# Patient Record
Sex: Male | Born: 1970 | Race: Asian | Hispanic: No | Marital: Married | Smoking: Never smoker
Health system: Southern US, Community
[De-identification: ages and names within clinical notes are randomized; demographics above are authoritative.]

## PROBLEM LIST (undated history)

## (undated) ENCOUNTER — Emergency Department (HOSPITAL_COMMUNITY)
Admission: EM | Discharge status: Against Medical Advice | Payer: PRIVATE HEALTH INSURANCE | Attending: Emergency Medicine | Admitting: Emergency Medicine

## (undated) DIAGNOSIS — R519 Headache, unspecified: Secondary | ICD-10-CM

## (undated) DIAGNOSIS — M109 Gout, unspecified: Secondary | ICD-10-CM

## (undated) DIAGNOSIS — I82409 Acute embolism and thrombosis of unspecified deep veins of unspecified lower extremity: Secondary | ICD-10-CM

## (undated) DIAGNOSIS — R51 Headache: Secondary | ICD-10-CM

## (undated) DIAGNOSIS — I10 Essential (primary) hypertension: Secondary | ICD-10-CM

---

## 2001-04-03 ENCOUNTER — Encounter: Payer: Self-pay | Admitting: Emergency Medicine

## 2001-04-03 ENCOUNTER — Inpatient Hospital Stay (HOSPITAL_COMMUNITY): Admission: EM | Admit: 2001-04-03 | Discharge: 2001-04-06 | Payer: Self-pay | Admitting: Emergency Medicine

## 2001-04-04 ENCOUNTER — Encounter: Payer: Self-pay | Admitting: Critical Care Medicine

## 2001-05-01 ENCOUNTER — Emergency Department (HOSPITAL_COMMUNITY): Admission: EM | Admit: 2001-05-01 | Discharge: 2001-05-02 | Payer: Self-pay

## 2001-05-01 ENCOUNTER — Encounter: Payer: Self-pay | Admitting: *Deleted

## 2007-02-28 ENCOUNTER — Emergency Department (HOSPITAL_COMMUNITY): Admission: EM | Admit: 2007-02-28 | Discharge: 2007-02-28 | Payer: Self-pay | Admitting: Emergency Medicine

## 2008-02-15 ENCOUNTER — Encounter: Admission: RE | Admit: 2008-02-15 | Discharge: 2008-02-15 | Payer: Self-pay | Admitting: Pulmonary Disease

## 2008-09-24 ENCOUNTER — Emergency Department (HOSPITAL_COMMUNITY): Admission: EM | Admit: 2008-09-24 | Discharge: 2008-09-24 | Payer: Self-pay | Admitting: Family Medicine

## 2009-01-30 IMAGING — CR DG CHEST 2V
2 series · 2 of 2 positions shown · non-contrast
Comparison: None

CLINICAL DATA: Exposure to tuberculosis

CHEST - 2 VIEW

[view not recorded (1 of 2)]
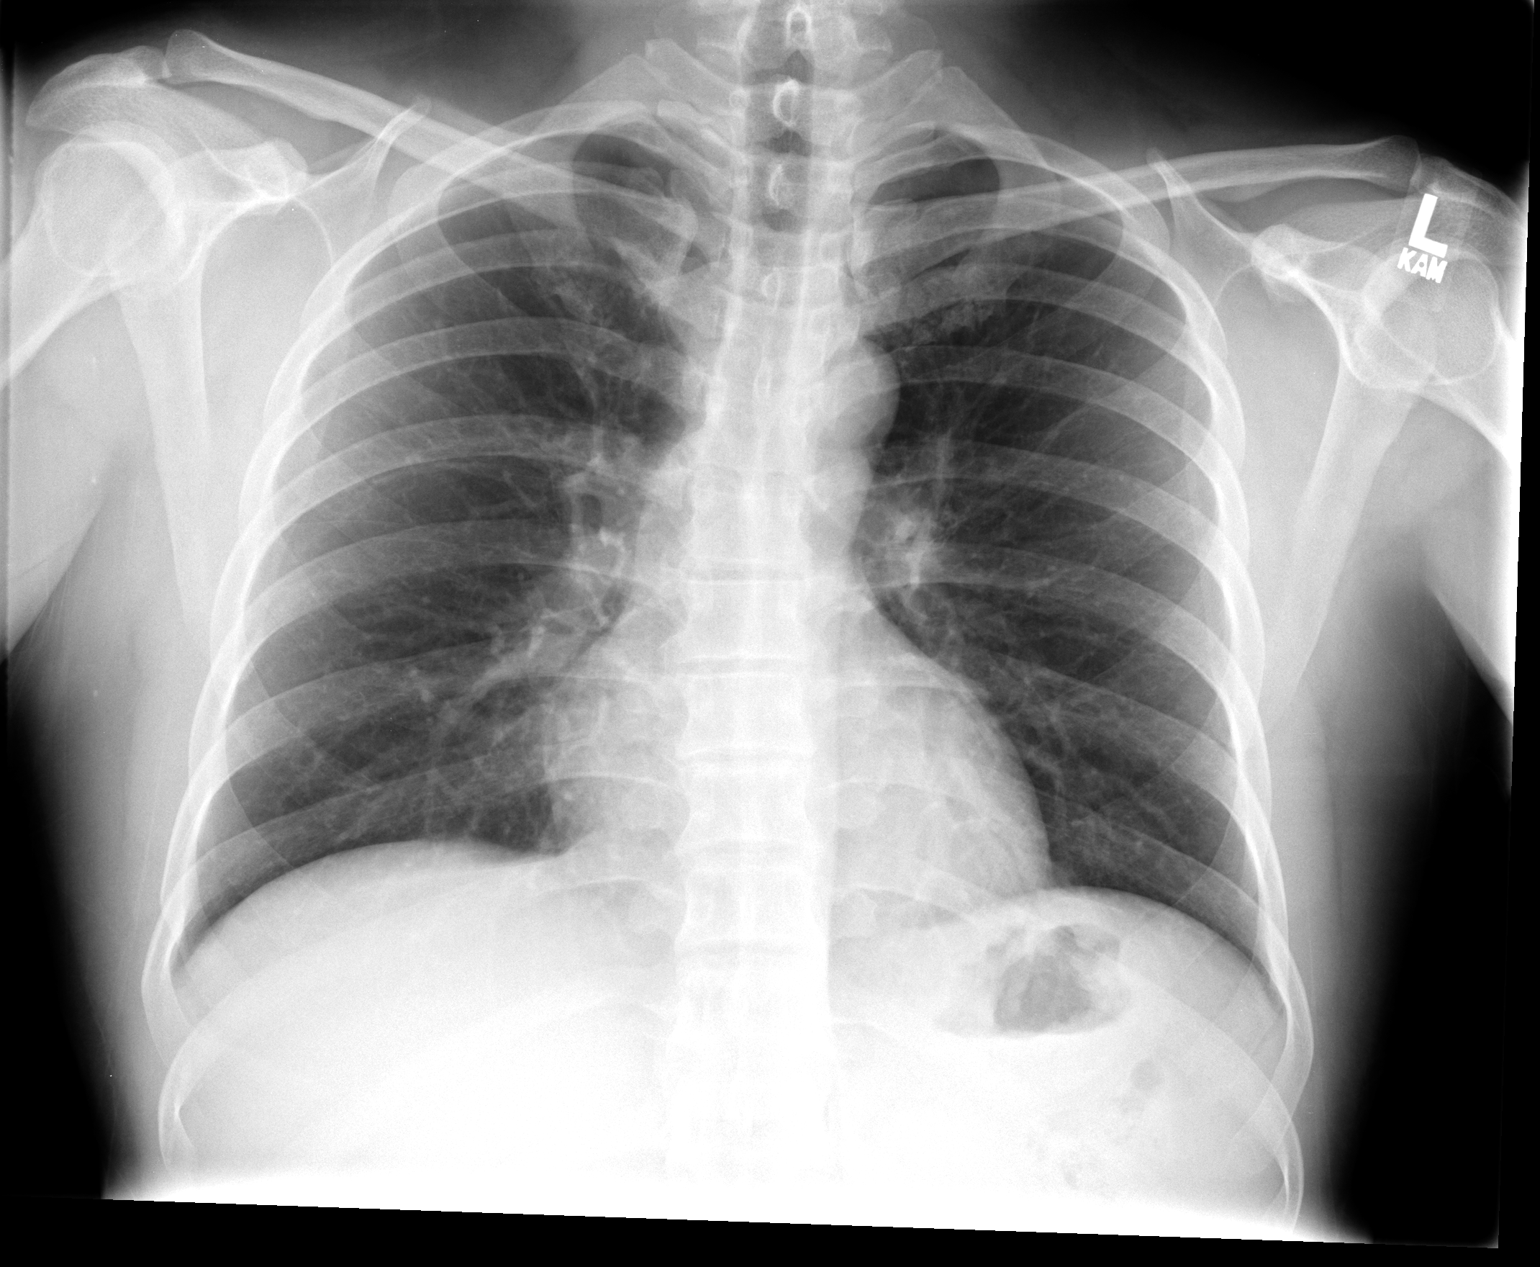

[view not recorded (2 of 2)]
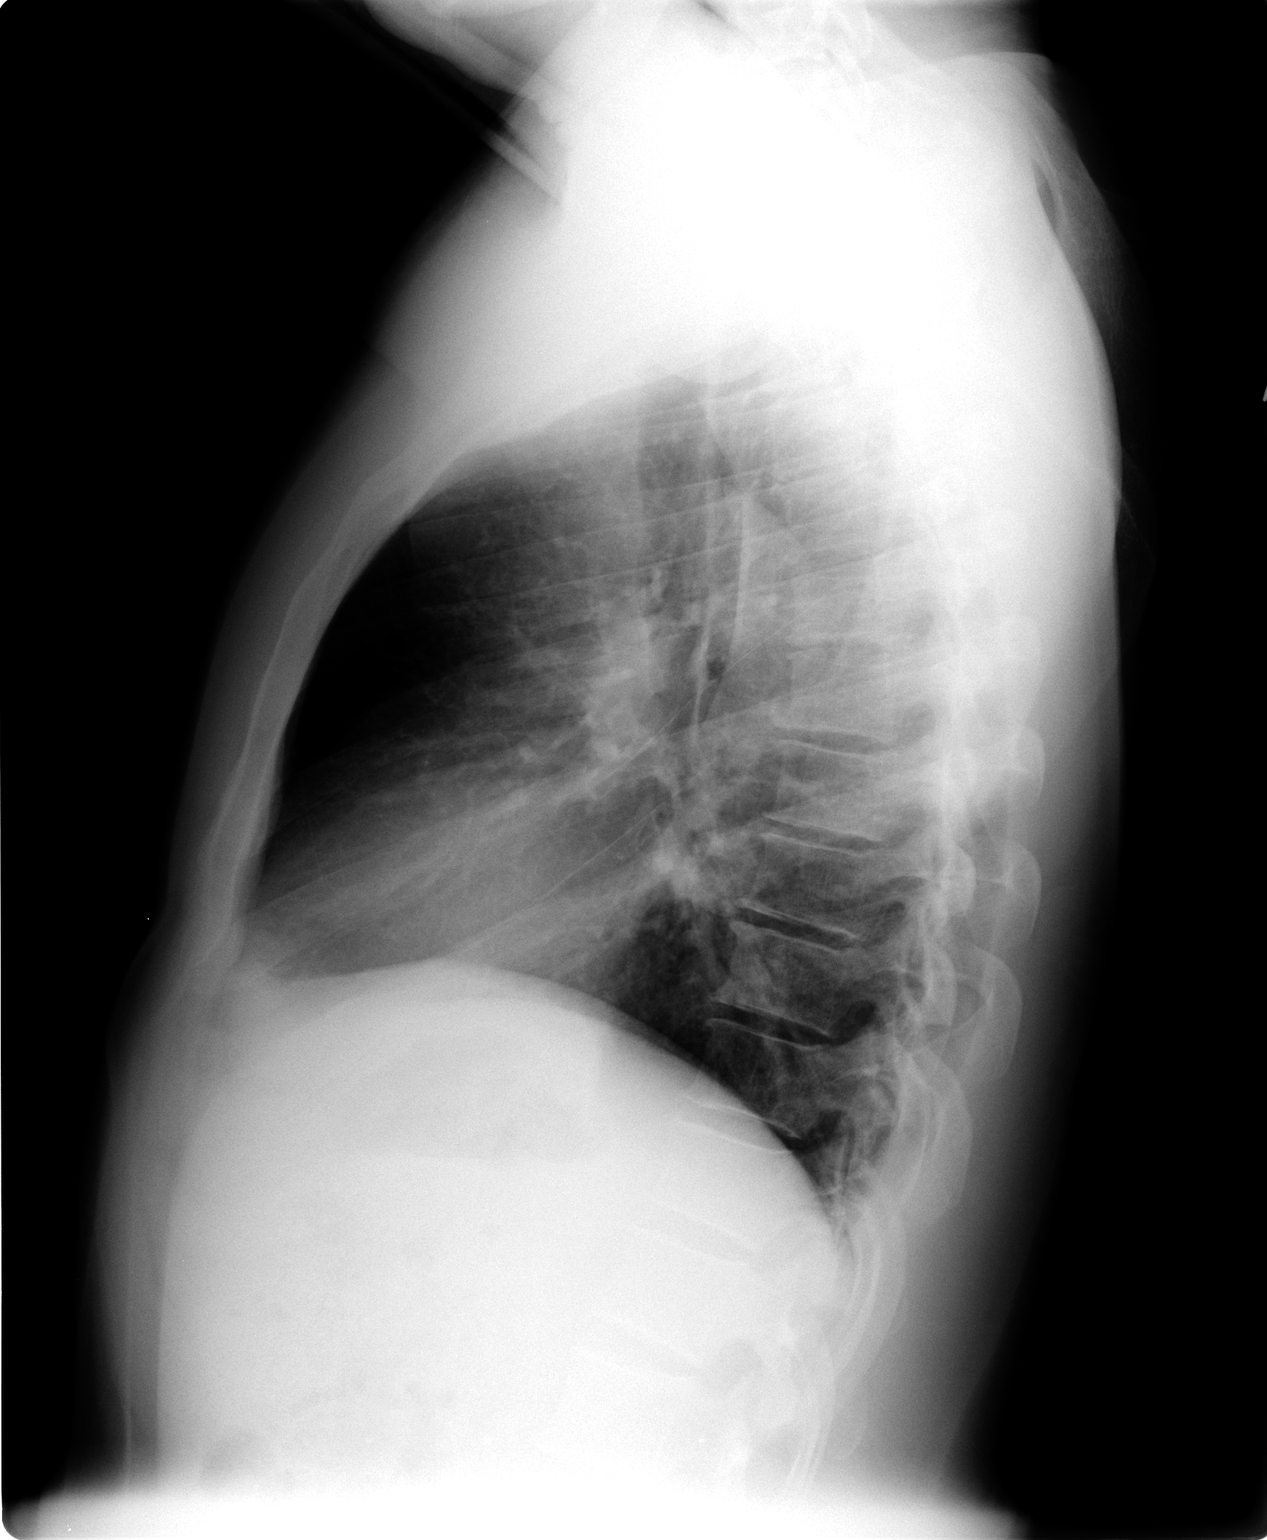

[2 of 2 positions shown; findings below may reference images not displayed]

FINDINGS: No active infiltrate or effusion is seen.  No adenopathy
is noted.  The heart is within normal limits in size.  No bony
abnormality is seen.
IMPRESSION: No active lung disease.

## 2009-08-10 ENCOUNTER — Emergency Department (HOSPITAL_COMMUNITY): Admission: EM | Admit: 2009-08-10 | Discharge: 2009-08-10 | Payer: Self-pay | Admitting: Obstetrics and Gynecology

## 2009-10-28 ENCOUNTER — Emergency Department (HOSPITAL_COMMUNITY): Admission: EM | Admit: 2009-10-28 | Discharge: 2009-10-28 | Payer: Self-pay | Admitting: Emergency Medicine

## 2010-04-08 LAB — BASIC METABOLIC PANEL
BUN: 12 mg/dL (ref 6–23)
CO2: 27 mEq/L (ref 19–32)
Calcium: 9.4 mg/dL (ref 8.4–10.5)
Chloride: 103 mEq/L (ref 96–112)
GFR calc Af Amer: >60 mL/min (ref >60)

## 2010-04-08 LAB — DIFFERENTIAL
Basophils Relative: 0 % (ref 0–1)
Eosinophils Absolute: 0.4 10*3/uL (ref 0.0–0.7)
Eosinophils Relative: 5 % (ref 0–5)
Lymphocytes Relative: 35 % (ref 12–46)
Lymphs Abs: 2.8 10*3/uL (ref 0.7–4.0)
Neutro Abs: 4.3 10*3/uL (ref 1.7–7.7)

## 2010-04-08 LAB — URINALYSIS, ROUTINE W REFLEX MICROSCOPIC
Bilirubin Urine: NEGATIVE
Hgb urine dipstick: NEGATIVE
Nitrite: NEGATIVE
Urobilinogen, UA: 0.2 mg/dL (ref 0.0–1.0)

## 2010-04-08 LAB — CBC
Hemoglobin: 15.5 g/dL (ref 13.0–17.0)
WBC: 8.1 10*3/uL (ref 4.0–10.5)
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
## 2010-07-27 ENCOUNTER — Emergency Department (HOSPITAL_COMMUNITY)
Admission: EM | Admit: 2010-07-27 | Discharge: 2010-07-27 | Disposition: A | Discharge status: Home / Self Care | Payer: PRIVATE HEALTH INSURANCE | Attending: Emergency Medicine | Admitting: Emergency Medicine

## 2010-07-27 DIAGNOSIS — H5710 Ocular pain, unspecified eye: Secondary | ICD-10-CM | POA: Insufficient documentation

## 2010-07-27 DIAGNOSIS — R51 Headache: Secondary | ICD-10-CM | POA: Insufficient documentation

## 2010-07-27 DIAGNOSIS — R112 Nausea with vomiting, unspecified: Secondary | ICD-10-CM | POA: Insufficient documentation

## 2010-07-27 DIAGNOSIS — H53149 Visual discomfort, unspecified: Secondary | ICD-10-CM | POA: Insufficient documentation

## 2010-07-27 DIAGNOSIS — R109 Unspecified abdominal pain: Secondary | ICD-10-CM | POA: Insufficient documentation

## 2010-07-27 LAB — CBC
MCV: 68.1 fL — ABNORMAL LOW (ref 78.0–100.0)
RDW: 13.9 % (ref 11.5–15.5)
WBC: 7.7 10*3/uL (ref 4.0–10.5)

## 2010-07-27 LAB — DIFFERENTIAL
Basophils Absolute: 0.1 10*3/uL (ref 0.0–0.1)
Eosinophils Relative: 5 % (ref 0–5)
Lymphocytes Relative: 37 % (ref 12–46)
Monocytes Absolute: 0.5 10*3/uL (ref 0.1–1.0)
Monocytes Relative: 7 % (ref 3–12)
Neutro Abs: 3.9 10*3/uL (ref 1.7–7.7)
Neutrophils Relative %: 50 % (ref 43–77)

## 2010-07-27 LAB — COMPREHENSIVE METABOLIC PANEL
Albumin: 4.7 g/dL (ref 3.5–5.2)
Alkaline Phosphatase: 113 U/L (ref 39–117)
CO2: 25 mEq/L (ref 19–32)
Calcium: 10.7 mg/dL — ABNORMAL HIGH (ref 8.4–10.5)
Total Bilirubin: 0.7 mg/dL (ref 0.3–1.2)

## 2010-07-27 LAB — POCT I-STAT, CHEM 8
BUN: 12 mg/dL (ref 6–23)
Creatinine, Ser: 1 mg/dL (ref 0.50–1.35)
Glucose, Bld: 98 mg/dL (ref 70–99)
HCT: 55 % — ABNORMAL HIGH (ref 39.0–52.0)
Hemoglobin: 18.7 g/dL — ABNORMAL HIGH (ref 13.0–17.0)
Sodium: 139 mEq/L (ref 135–145)
TCO2: 26 mmol/L (ref 0–100)

## 2010-07-27 LAB — LIPASE, BLOOD: Lipase: 43 U/L (ref 11–59)

## 2010-10-16 LAB — POCT URINALYSIS DIP (DEVICE)
Nitrite: NEGATIVE
Operator id: 235561
Specific Gravity, Urine: 1.01
pH: 7

## 2011-01-18 DIAGNOSIS — R079 Chest pain, unspecified: Secondary | ICD-10-CM | POA: Insufficient documentation
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
## 2011-02-24 ENCOUNTER — Encounter (HOSPITAL_COMMUNITY): Payer: Self-pay | Admitting: *Deleted

## 2011-02-24 ENCOUNTER — Emergency Department (HOSPITAL_COMMUNITY)
Admission: EM | Admit: 2011-02-24 | Discharge: 2011-02-24 | Disposition: A | Discharge status: Home / Self Care | Payer: PRIVATE HEALTH INSURANCE | Source: Home / Self Care | Attending: Family Medicine | Admitting: Family Medicine

## 2011-02-24 DIAGNOSIS — H11001 Unspecified pterygium of right eye: Secondary | ICD-10-CM

## 2011-02-24 DIAGNOSIS — H11009 Unspecified pterygium of unspecified eye: Secondary | ICD-10-CM

## 2011-02-24 HISTORY — DX: Headache: R51

## 2011-02-24 HISTORY — DX: Headache, unspecified: R51.9

## 2011-02-24 MED ORDER — TOBRAMYCIN 0.3 % OP SOLN: 1.0000 [drp] | Freq: Four times a day (QID) | OPHTHALMIC | Status: AC

## 2011-02-24 NOTE — ED Provider Notes (Signed)
History     CSN: [id number]  Arrival date & time 02/24/11  1721   First MD Initiated Contact with Patient 02/24/11 1745      Chief Complaint  Patient presents with  . Eye Problem    (Consider location/radiation/quality/duration/timing/severity/associated sxs/prior treatment) Patient is a 41 y.o. male presenting with eye problem. The history is provided by the patient and the spouse.  Eye Problem  This is a chronic problem. The current episode started more than 1 week ago (since may 2012). The problem has not changed since onset.There is pain in the right eye. The injury mechanism is unknown. The pain is mild. There is no history of trauma to the eye. There is no known exposure to pink eye. Associated symptoms include foreign body sensation and eye redness.    Past Medical History  Diagnosis Date  . Generalized headaches     History reviewed. No pertinent past surgical history.  History reviewed. No pertinent family history.  History  Substance Use Topics  . Smoking status: Not on file  . Smokeless tobacco: Not on file  . Alcohol Use:       Review of Systems  Constitutional: Negative.   HENT: Negative.   Eyes: Positive for redness.    Allergies  Review of patient's allergies indicates no known allergies.  Home Medications   Current Outpatient Rx  Name Route Sig Dispense Refill  . TOBRAMYCIN SULFATE 0.3 % OP SOLN Right Eye Place 1 drop into the right eye every 6 (six) hours. 5 mL 0    BP 138/97  Pulse 95  Temp(Src) 97.5 F (36.4 C) (Oral)  Resp 18  SpO2 99%  Physical Exam  Nursing note and vitals reviewed. Constitutional: He appears well-developed and well-nourished.  HENT:  Head: Normocephalic.  Eyes: EOM and lids are normal. Pupils are equal, round, and reactive to light. No foreign bodies found. Right eye exhibits no discharge. Left eye exhibits no discharge.  Slit lamp exam:      The right eye shows no corneal abrasion, no foreign body, no  hyphema, no hypopyon and no fluorescein uptake.      ED Course  Procedures (including critical care time)  Labs Reviewed - No data to display No results found.   1. Pterygium of right eye       MDM  Slit lamp--pterygium medially., no fb.        Barkley Bruns, MD 02/24/11 [phone]

## 2011-02-24 NOTE — ED Notes (Signed)
Pt  Reports     Irritation  r  Eye      Pt   Reports  He  May  Have  Gotten      Something in his  r  Eye   Almost  9  momths    Ago             Pt   Reports   Blurred  Vision as  Well           Pt  Has      intermittant   rfedness  Of  Eyes  As  Well

## 2011-02-24 NOTE — ED Notes (Signed)
Visual  Acuity  20/40  r   20/25  l

## 2011-11-26 ENCOUNTER — Encounter (HOSPITAL_COMMUNITY): Payer: Self-pay | Admitting: Emergency Medicine

## 2011-11-26 ENCOUNTER — Emergency Department (HOSPITAL_COMMUNITY)
Admission: EM | Admit: 2011-11-26 | Discharge: 2011-11-26 | Disposition: A | Discharge status: Home / Self Care | Payer: PRIVATE HEALTH INSURANCE | Source: Home / Self Care | Attending: Emergency Medicine | Admitting: Emergency Medicine

## 2011-11-26 DIAGNOSIS — G43909 Migraine, unspecified, not intractable, without status migrainosus: Secondary | ICD-10-CM

## 2011-11-26 MED ORDER — DIPHENHYDRAMINE HCL 50 MG/ML IJ SOLN
25.0000 mg | Freq: Once | INTRAMUSCULAR | Status: AC
  Administered 2011-11-26: 25 mg via INTRAMUSCULAR

## 2011-11-26 MED ORDER — DEXAMETHASONE SODIUM PHOSPHATE 10 MG/ML IJ SOLN
INTRAMUSCULAR | Status: AC
  Filled 2011-11-26: qty 1

## 2011-11-26 MED ORDER — SUMATRIPTAN SUCCINATE 50 MG PO TABS: 50.0000 mg | ORAL_TABLET | ORAL | Status: DC | PRN

## 2011-11-26 MED ORDER — PROPRANOLOL HCL ER 60 MG PO CP24: 60.0000 mg | ORAL_CAPSULE | Freq: Every day | ORAL | Status: DC

## 2011-11-26 MED ORDER — DEXAMETHASONE SODIUM PHOSPHATE 10 MG/ML IJ SOLN
10.0000 mg | Freq: Once | INTRAMUSCULAR | Status: AC
  Administered 2011-11-26: 10 mg via INTRAMUSCULAR

## 2011-11-26 MED ORDER — KETOROLAC TROMETHAMINE 60 MG/2ML IM SOLN
INTRAMUSCULAR | Status: AC
  Filled 2011-11-26: qty 2

## 2011-11-26 MED ORDER — KETOROLAC TROMETHAMINE 60 MG/2ML IM SOLN
60.0000 mg | Freq: Once | INTRAMUSCULAR | Status: AC
  Administered 2011-11-26: 60 mg via INTRAMUSCULAR

## 2011-11-26 MED ORDER — DIPHENHYDRAMINE HCL 50 MG/ML IJ SOLN
INTRAMUSCULAR | Status: AC
  Filled 2011-11-26: qty 1

## 2011-11-26 NOTE — ED Provider Notes (Signed)
Chief Complaint  Patient presents with  . Migraine    History of Present Illness:   The patient is a 41 year old male who has had a many year long history of recurring migraine headaches. He has been treated with Imitrex in the past with good results. He has never taken any migraine prevention medication. The current headache began 3 days ago. There was no specific precipitating factor. His right-sided and radiates to his eye and throbbing and severe. It's associated with nausea, vomiting, phonophobia, photophobia, and blurry vision. In addition to this his felt chilled and weak all over. He denies any paresthesias, localized muscle weakness, difficulty with speech, swallowing, or ambulation. No fever, chills, or stiff neck. He denies any back pain.  Review of Systems:  Other than noted above, the patient denies any of the following symptoms: Systemic:  No fever, chills, fatigue, photophobia, stiff neck. Eye:  No redness, eye pain, discharge, blurred vision, or diplopia. ENT:  No nasal congestion, rhinorrhea, sinus pressure or pain, sneezing, earache, or sore throat.  No jaw claudication. Neuro:  No paresthesias, loss of consciousness, seizure activity, muscle weakness, trouble with coordination or gait, trouble speaking or swallowing. Psych:  No depression, anxiety or trouble sleeping.  PMFSH:  Past medical history, family history, social history, meds, and allergies were reviewed.  Physical Exam:   Vital signs:  BP 123/81  Pulse 65  Temp 97.5 F (36.4 C) (Oral)  Resp 18 General:  Alert and oriented.  In no distress. Eye:  Lids and conjunctivas normal.  PERRL,  Full EOMs.  Fundi benign with normal discs and vessels. ENT:  No cranial or facial tenderness to palpation.  TMs and canals clear.  Nasal mucosa was normal and uncongested without any drainage. No intra oral lesions, pharynx clear, mucous membranes moist, dentition normal. Neck:  Supple, full ROM, no tenderness to palpation.  No  adenopathy or mass. Neuro:  Alert and orented times 3.  Speech was clear, fluent, and appropriate.  Cranial nerves intact. No pronator drift, muscle strength normal. Finger to nose normal.  DTRs were 2+ and symmetrical.Station and gait were normal.  Romberg's sign was normal.  Able to perform tandem gait well. Psych:  Normal affect.  Medications given in [facility]:  He was given Decadron 10 mg IM, Toradol 60 mg IM, and Benadryl 25 mg IM and tolerated this well without any immediate side effects.  Assessment:  The encounter diagnosis was Migraine headache.  Plan:   1.  The following meds were prescribed:   New Prescriptions   PROPRANOLOL ER (INDERAL LA) 60 MG 24 HR CAPSULE    Take 1 capsule (60 mg total) by mouth daily.   SUMATRIPTAN (IMITREX) 50 MG TABLET    Take 1 tablet (50 mg total) by mouth every 2 (two) hours as needed for migraine.   2.  The patient was instructed in symptomatic care and handouts were given. 3.  The patient was told to return if becoming worse in any way, if no better in 3 or 4 days, and given some red flag symptoms that would indicate earlier return.    Reuben Likes, MD 11/26/11 2034

## 2011-11-26 NOTE — ED Notes (Signed)
Reports having headache for three days.  Right eye pain with headache.   Tried OTC medication but no relief

## 2013-07-03 ENCOUNTER — Emergency Department (HOSPITAL_COMMUNITY)
Admission: EM | Admit: 2013-07-03 | Discharge: 2013-07-03 | Disposition: A | Discharge status: Home / Self Care | Payer: BC Managed Care – PPO | Source: Home / Self Care | Attending: Family Medicine | Admitting: Family Medicine

## 2013-07-03 ENCOUNTER — Encounter (HOSPITAL_COMMUNITY): Payer: Self-pay | Admitting: Emergency Medicine

## 2013-07-03 ENCOUNTER — Emergency Department (INDEPENDENT_AMBULATORY_CARE_PROVIDER_SITE_OTHER): Payer: BC Managed Care – PPO

## 2013-07-03 DIAGNOSIS — J3489 Other specified disorders of nose and nasal sinuses: Secondary | ICD-10-CM

## 2013-07-03 DIAGNOSIS — R0981 Nasal congestion: Secondary | ICD-10-CM

## 2013-07-03 LAB — POCT I-STAT, CHEM 8
BUN: 19 mg/dL (ref 6–23)
CHLORIDE: 100 meq/L (ref 96–112)
Calcium, Ion: 1.24 mmol/L — ABNORMAL HIGH (ref 1.12–1.23)
Creatinine, Ser: 1 mg/dL (ref 0.50–1.35)
GLUCOSE: 96 mg/dL (ref 70–99)
HEMATOCRIT: 49 % (ref 39.0–52.0)
HEMOGLOBIN: 16.7 g/dL (ref 13.0–17.0)
POTASSIUM: 4 meq/L (ref 3.7–5.3)
Sodium: 141 mEq/L (ref 137–147)
TCO2: 30 mmol/L (ref 0–100)

## 2013-07-03 LAB — CK: CK TOTAL: 236 U/L — AB (ref 7–232)

## 2013-07-03 LAB — CBC
HEMATOCRIT: 42.9 % (ref 39.0–52.0)
Hemoglobin: 13.8 g/dL (ref 13.0–17.0)
MCH: 22.5 pg — AB (ref 26.0–34.0)
MCHC: 32.2 g/dL (ref 30.0–36.0)
MCV: 69.9 fL — AB (ref 78.0–100.0)
PLATELETS: 276 10*3/uL (ref 150–400)
RBC: 6.14 MIL/uL — ABNORMAL HIGH (ref 4.22–5.81)
RDW: 13.9 % (ref 11.5–15.5)
WBC: 7.5 10*3/uL (ref 4.0–10.5)

## 2013-07-03 MED ORDER — PREDNISONE 5 MG PO KIT: PACK | ORAL | Status: DC

## 2013-07-03 MED ORDER — FLUTICASONE PROPIONATE 50 MCG/ACT NA SUSP: 2.0000 | Freq: Every day | NASAL | Status: DC

## 2013-07-03 NOTE — ED Provider Notes (Signed)
Danny Kim is a 43 y.o. male who presents to Urgent Care today for body aches associated with coughing congestion. Symptoms present for 2 weeks. Patient notes occasional blood-tinged sputum. He denies any shortness of breath fevers or chills. Denies any nausea vomiting or diarrhea. He has tried Tylenol which helps some. Denies any headache lightheadedness or dizziness.   Past Medical History  Diagnosis Date  . Generalized headaches    History  Substance Use Topics  . Smoking status: Never Smoker   . Smokeless tobacco: Not on file  . Alcohol Use: Not on file   ROS as above Medications: No current facility-administered medications for this encounter.   Current Outpatient Prescriptions  Medication Sig Dispense Refill  . fluticasone (FLONASE) 50 MCG/ACT nasal spray Place 2 sprays into both nostrils daily.  16 g  2  . PredniSONE 5 MG KIT 12 day dosepack po  1 kit  0  . propranolol ER (INDERAL LA) 60 MG 24 hr capsule Take 1 capsule (60 mg total) by mouth daily.  30 capsule  3  . SUMAtriptan (IMITREX) 50 MG tablet Take 1 tablet (50 mg total) by mouth every 2 (two) hours as needed for migraine.  10 tablet  0    Exam:  BP 121/84  Pulse 71  Temp(Src) 98 F (36.7 C) (Oral)  Resp 18  SpO2 98% Gen: Well NAD HEENT: EOMI,  MMM normal posterior pharynx and tympanic membranes. Lungs: Normal work of breathing. CTABL Heart: RRR no MRG Abd: NABS, Soft. NT, ND Exts: Brisk capillary refill, warm and well perfused. Mildly sore upper and lower extremities to touch. Diffuse. Normal motion throughout.  Results for orders placed during the hospital encounter of 07/03/13 (from the past 24 hour(s))  CK     Status: Abnormal   Collection Time    07/03/13 11:44 AM      Result Value Ref Range   Total CK 236 (*) 7 - 232 U/L  CBC     Status: Abnormal   Collection Time    07/03/13 11:44 AM      Result Value Ref Range   WBC 7.5  4.0 - 10.5 K/uL   RBC 6.14 (*) 4.22 - 5.81 MIL/uL   Hemoglobin 13.8  13.0 -  17.0 g/dL   HCT 42.9  39.0 - 52.0 %   MCV 69.9 (*) 78.0 - 100.0 fL   MCH 22.5 (*) 26.0 - 34.0 pg   MCHC 32.2  30.0 - 36.0 g/dL   RDW 13.9  11.5 - 15.5 %   Platelets 276  150 - 400 K/uL  POCT I-STAT, CHEM 8     Status: Abnormal   Collection Time    07/03/13 11:49 AM      Result Value Ref Range   Sodium 141  137 - 147 mEq/L   Potassium 4.0  3.7 - 5.3 mEq/L   Chloride 100  96 - 112 mEq/L   BUN 19  6 - 23 mg/dL   Creatinine, Ser 1.00  0.50 - 1.35 mg/dL   Glucose, Bld 96  70 - 99 mg/dL   Calcium, Ion 1.24 (*) 1.12 - 1.23 mmol/L   TCO2 30  0 - 100 mmol/L   Hemoglobin 16.7  13.0 - 17.0 g/dL   HCT 49.0  39.0 - 52.0 %   Dg Chest 2 View  07/03/2013   CLINICAL DATA:  Cough.  Hemoptysis.  Chest congestion.  Chest pain.  EXAM: CHEST  2 VIEW  COMPARISON:  02/15/2008  FINDINGS:  Heart size and pulmonary vascularity are normal and the lungs are clear. No effusions. No osseous abnormality.  IMPRESSION: Normal exam, unchanged.   Electronically Signed   By: Rozetta Nunnery M.D.   On: 07/03/2013 11:42    Assessment and Plan: 43 y.o. male with viral uri with possible myositis.  Mildly elevated CK level. Other labs WNL.  Plan for prednisone for cough and congestion.  Follow up with PCP to follow up elevated CK.  Called patient and left a message.   Discussed warning signs or symptoms. Please see discharge instructions. Patient expresses understanding.    Gregor Hams, MD 07/03/13 [phone]

## 2013-07-03 NOTE — ED Notes (Signed)
C/o sick x 1 month w general body aches, dizziness . NAD

## 2013-07-03 NOTE — Discharge Instructions (Signed)
Thank you for coming in today. I will call you with results if abnormal.  Take prednisone.  Use the nasal spray.  Follow up with a primary doctor.  Call or go to the emergency room if you get worse, have trouble breathing, have chest pains, or palpitations.   Sinusitis Sinusitis is redness, soreness, and swelling (inflammation) of the paranasal sinuses. Paranasal sinuses are air pockets within the bones of your face (beneath the eyes, the middle of the forehead, or above the eyes). In healthy paranasal sinuses, mucus is able to drain out, and air is able to circulate through them by way of your nose. However, when your paranasal sinuses are inflamed, mucus and air can become trapped. This can allow bacteria and other germs to grow and cause infection. Sinusitis can develop quickly and last only a short time (acute) or continue over a long period (chronic). Sinusitis that lasts for more than 12 weeks is considered chronic.  CAUSES  Causes of sinusitis include:  Allergies.  Structural abnormalities, such as displacement of the cartilage that separates your nostrils (deviated septum), which can decrease the air flow through your nose and sinuses and affect sinus drainage.  Functional abnormalities, such as when the small hairs (cilia) that line your sinuses and help remove mucus do not work properly or are not present. SYMPTOMS  Symptoms of acute and chronic sinusitis are the same. The primary symptoms are pain and pressure around the affected sinuses. Other symptoms include:  Upper toothache.  Earache.  Headache.  Bad breath.  Decreased sense of smell and taste.  A cough, which worsens when you are lying flat.  Fatigue.  Fever.  Thick drainage from your nose, which often is green and may contain pus (purulent).  Swelling and warmth over the affected sinuses. DIAGNOSIS  Your caregiver will perform a physical exam. During the exam, your caregiver may:  Look in your nose for  signs of abnormal growths in your nostrils (nasal polyps).  Tap over the affected sinus to check for signs of infection.  View the inside of your sinuses (endoscopy) with a special imaging device with a light attached (endoscope), which is inserted into your sinuses. If your caregiver suspects that you have chronic sinusitis, one or more of the following tests may be recommended:  Allergy tests.  Nasal culture A sample of mucus is taken from your nose and sent to a lab and screened for bacteria.  Nasal cytology A sample of mucus is taken from your nose and examined by your caregiver to determine if your sinusitis is related to an allergy. TREATMENT  Most cases of acute sinusitis are related to a viral infection and will resolve on their own within 10 days. Sometimes medicines are prescribed to help relieve symptoms (pain medicine, decongestants, nasal steroid sprays, or saline sprays).  However, for sinusitis related to a bacterial infection, your caregiver will prescribe antibiotic medicines. These are medicines that will help kill the bacteria causing the infection.  Rarely, sinusitis is caused by a fungal infection. In theses cases, your caregiver will prescribe antifungal medicine. For some cases of chronic sinusitis, surgery is needed. Generally, these are cases in which sinusitis recurs more than 3 times per year, despite other treatments. HOME CARE INSTRUCTIONS   Drink plenty of water. Water helps thin the mucus so your sinuses can drain more easily.  Use a humidifier.  Inhale steam 3 to 4 times a day (for example, sit in the bathroom with the shower running).  Apply  a warm, moist washcloth to your face 3 to 4 times a day, or as directed by your caregiver.  Use saline nasal sprays to help moisten and clean your sinuses.  Take over-the-counter or prescription medicines for pain, discomfort, or fever only as directed by your caregiver. SEEK IMMEDIATE MEDICAL CARE IF:  You have  increasing pain or severe headaches.  You have nausea, vomiting, or drowsiness.  You have swelling around your face.  You have vision problems.  You have a stiff neck.  You have difficulty breathing. MAKE SURE YOU:   Understand these instructions.  Will watch your condition.  Will get help right away if you are not doing well or get worse. Document Released: 01/11/2005 Document Revised: 04/05/2011 Document Reviewed: 01/26/2011 [facility] Patient Information 2014 [place], [place].   PRIMARY CARE Merchant navy officerDOCTORS [facility] [address]  [place], [place] [phone]  Fax [phone]  Nature conservation officerLeBauer HealthCare at [facility] [address]  [place], [place] [phone]  Fax [phone]  Nature conservation officerLeBauer HealthCare at [place] / [place] [phone]  [place], [place] Ph [phone]  Fax [phone]  [facility] [address]  [place], [place] [phone]  Fax [phone]  [facility] [address]  [place], [place] [phone]  Fax [phone]  [facility] at [facility] [address] [place]  [place], [place] [phone]  Fax [phone]   [facility] [place] [address] [place] [address] Phone: [phone]   [facility] @ [facility] [address] [place] [address] Phone: [phone]   [facility] @ [place] [address] [place] [address] Phone: [phone]   [facility] @ [place] [address] [place] [address] Phone: [phone]   [facility] @ [place] [address] [place] [address] Phone: [phone] Fax: [phone]   [facility] @ [place] [address] [place] [address] Phone:  [phone]

## 2013-07-04 ENCOUNTER — Telehealth (HOSPITAL_COMMUNITY): Payer: Self-pay | Admitting: Family Medicine

## 2013-07-04 NOTE — ED Notes (Signed)
Patient called back.  Discussed elevated CK.  F/u with PCP  Rodolph Bong, MD 07/04/13 [phone]

## 2013-08-17 ENCOUNTER — Encounter (HOSPITAL_COMMUNITY): Payer: Self-pay | Admitting: Emergency Medicine

## 2013-08-17 ENCOUNTER — Emergency Department (HOSPITAL_COMMUNITY)
Admission: EM | Admit: 2013-08-17 | Discharge: 2013-08-17 | Disposition: A | Discharge status: Home / Self Care | Payer: BC Managed Care – PPO | Source: Home / Self Care

## 2013-08-17 DIAGNOSIS — IMO0001 Reserved for inherently not codable concepts without codable children: Secondary | ICD-10-CM

## 2013-08-17 DIAGNOSIS — M791 Myalgia, unspecified site: Secondary | ICD-10-CM

## 2013-08-17 MED ORDER — DICLOFENAC POTASSIUM 50 MG PO TABS: 50.0000 mg | ORAL_TABLET | Freq: Three times a day (TID) | ORAL | Status: DC

## 2013-08-17 MED ORDER — DICLOFENAC SODIUM 1 % TD GEL: 1.0000 "application " | Freq: Four times a day (QID) | TRANSDERMAL | Status: DC

## 2013-08-17 NOTE — ED Provider Notes (Signed)
CSN: [id number]     Arrival date & time 08/17/13  1413 History   First MD Initiated Contact with Patient 08/17/13 1530     No chief complaint on file.  (Consider location/radiation/quality/duration/timing/severity/associated sxs/prior Treatment) HPI Comments: Muscle pain/soreness in the right axillary musculature for 1 month. 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 Other areas of muscle soreness and achiness in Left arm and low back. No known trauma/injury or single event to produce pain.    Past Medical History  Diagnosis Date  . Generalized headaches    No past surgical history on file. No family history on file. History  Substance Use Topics  . Smoking status: Never Smoker   . Smokeless tobacco: Not on file  . Alcohol Use: Not on file    Review of Systems  Constitutional: Negative.   Respiratory: Negative.   Gastrointestinal: Negative.   Genitourinary: Negative.   Musculoskeletal: Positive for back pain and myalgias.       As per HPI  Skin: Negative.   Neurological: Negative for dizziness, weakness, numbness and headaches.    Allergies  Review of patient's allergies indicates no known allergies.  Home Medications   Prior to Admission medications   Medication Sig Start Date End Date Taking? Authorizing Provider  diclofenac (CATAFLAM) 50 MG tablet Take 1 tablet (50 mg total) by mouth 3 (three) times daily. One tablet TID with food prn pain. 08/17/13   Janne Napoleon, NP  diclofenac sodium (VOLTAREN) 1 % GEL Apply 1 application topically 4 (four) times daily. 08/17/13   Janne Napoleon, NP  fluticasone (FLONASE) 50 MCG/ACT nasal spray Place 2 sprays into both nostrils daily. 07/03/13   Gregor Hams, MD  PredniSONE 5 MG KIT 12 day dosepack po 07/03/13   Gregor Hams, MD  propranolol ER (INDERAL LA) 60 MG 24 hr capsule Take 1 capsule (60 mg total) by mouth daily. 11/26/11   Harden Mo, MD  SUMAtriptan (IMITREX) 50 MG tablet Take 1 tablet (50 mg total) by  mouth every 2 (two) hours as needed for migraine. 11/26/11   Harden Mo, MD   BP 121/79  Pulse 65  Temp(Src) 98.3 F (36.8 C) (Oral)  Resp 18  SpO2 98% Physical Exam  Nursing note and vitals reviewed. Constitutional: He is oriented to person, place, and time. He appears well-developed and well-nourished.  HENT:  Head: Normocephalic and atraumatic.  Eyes: EOM are normal. Left eye exhibits no discharge.  Neck: Normal range of motion. Neck supple.  Cardiovascular: Normal rate.   Pulmonary/Chest: Effort normal. No respiratory distress.  Musculoskeletal: Normal range of motion. He exhibits no edema.  Tenderness to the right pectoral muscle body near insertion point at shoulder. There is tenderness to deeper musculature as well as the inner aspect of the latissimus dorsi as it intersects with the humerus. No deformities, swelling, discoloration, masses or other abnormal findings.  Neurological: He is alert and oriented to person, place, and time. No cranial nerve deficit.  Skin: Skin is warm and dry.  Psychiatric: He has a normal mood and affect.    ED Course  Procedures (including critical care time) Labs Review Labs Reviewed - No data to display  Imaging Review No results found.   MDM   1. Myalgia    Diclofenac gel locally cataflam 50 tid prn Heat locally Limit activity that causes pain.     Janne Napoleon, NP 08/17/13 [phone]

## 2013-08-17 NOTE — Discharge Instructions (Signed)
Muscle Pain Muscle pain (myalgia) may be caused by many things, including:  Overuse or muscle strain, especially if you are not in shape. This is the most common cause of muscle pain.  Injury.  Bruises.  Viruses, such as the flu.  Infectious diseases.  Fibromyalgia, which is a chronic condition that causes muscle tenderness, fatigue, and headache.  Autoimmune diseases, including lupus.  Certain drugs, including ACE inhibitors and statins. Muscle pain may be mild or severe. In most cases, the pain lasts only a short time and goes away without treatment. To diagnose the cause of your muscle pain, your health care provider will take your medical history. This means he or she will ask you when your muscle pain began and what has been happening. If you have not had muscle pain for very long, your health care provider may want to wait before doing much testing. If your muscle pain has lasted a long time, your health care provider may want to run tests right away. If your health care provider thinks your muscle pain may be caused by illness, you may need to have additional tests to rule out certain conditions.  Treatment for muscle pain depends on the cause. Home care is often enough to relieve muscle pain. Your health care provider may also prescribe anti-inflammatory medicine. HOME CARE INSTRUCTIONS Watch your condition for any changes. The following actions may help to lessen any discomfort you are feeling:  Only take over-the-counter or prescription medicines as directed by your health care provider.  Use heat to sore muscles. Apply diclofenac gel as directed  You may alternate applying hot and cold packs to the muscle as directed by your health care provider.  If overuse is causing your muscle pain, slow down your activities until the pain goes away.  Remember that it is normal to feel some muscle pain after starting a workout program. Muscles that have not been used often will be sore  at first.  Do regular, gentle exercises if you are not usually active.  Warm up before exercising to lower your risk of muscle pain.  Do not continue working out if the pain is very bad. Bad pain could mean you have injured a muscle. SEEK MEDICAL CARE IF:  Your muscle pain gets worse, and medicines do not help.  You have muscle pain that lasts longer than 3 days.  You have a rash or fever along with muscle pain.  You have muscle pain after a tick bite.  You have muscle pain while working out, even though you are in good physical condition.  You have redness, soreness, or swelling along with muscle pain.  You have muscle pain after starting a new medicine or changing the dose of a medicine. SEEK IMMEDIATE MEDICAL CARE IF:  You have trouble breathing.  You have trouble swallowing.  You have muscle pain along with a stiff neck, fever, and vomiting.  You have severe muscle weakness or cannot move part of your body. MAKE SURE YOU:   Understand these instructions.  Will watch your condition.  Will get help right away if you are not doing well or get worse. Document Released: 12/03/2005 Document Revised: 01/16/2013 Document Reviewed: 11/07/2012 [facility]Exit[facility] Patient Information 2015 [place]ExitCare, [place]LL[place]. This information is not intended to replace advice given to you by your health care provider. Make sure you discuss any questions you have with your health care provider.

## 2013-08-17 NOTE — ED Notes (Signed)
Provider eval only 

## 2013-08-18 NOTE — ED Provider Notes (Signed)
Medical screening examination/treatment/procedure(s) were performed by resident physician or non-physician practitioner and as supervising physician I was immediately available for consultation/collaboration.   Barkley BrunsKINDL,Kim Oki DOUGLAS MD.   Linna HoffJames D Taetum Flewellen, MD 08/18/13 [phone]

## 2013-10-23 ENCOUNTER — Encounter: Payer: Self-pay | Admitting: *Deleted

## 2014-03-13 ENCOUNTER — Emergency Department (HOSPITAL_COMMUNITY)
Admission: EM | Admit: 2014-03-13 | Discharge: 2014-03-13 | Disposition: A | Discharge status: Home / Self Care | Payer: BLUE CROSS/BLUE SHIELD | Source: Home / Self Care | Attending: Family Medicine | Admitting: Family Medicine

## 2014-03-13 ENCOUNTER — Encounter (HOSPITAL_COMMUNITY): Payer: Self-pay | Admitting: Emergency Medicine

## 2014-03-13 DIAGNOSIS — M542 Cervicalgia: Secondary | ICD-10-CM

## 2014-03-13 DIAGNOSIS — M7552 Bursitis of left shoulder: Secondary | ICD-10-CM

## 2014-03-13 MED ORDER — BUPIVACAINE HCL (PF) 0.5 % IJ SOLN
INTRAMUSCULAR | Status: AC
  Filled 2014-03-13: qty 10

## 2014-03-13 MED ORDER — DICLOFENAC SODIUM 50 MG PO TBEC: 50.0000 mg | DELAYED_RELEASE_TABLET | Freq: Two times a day (BID) | ORAL | Status: DC | PRN

## 2014-03-13 MED ORDER — CYCLOBENZAPRINE HCL 10 MG PO TABS: 10.0000 mg | ORAL_TABLET | Freq: Every evening | ORAL | Status: DC | PRN

## 2014-03-13 MED ORDER — METHYLPREDNISOLONE ACETATE 40 MG/ML IJ SUSP
INTRAMUSCULAR | Status: AC
  Filled 2014-03-13: qty 1

## 2014-03-13 NOTE — Discharge Instructions (Signed)
Thank you for coming in today. Take Flexeril at bedtime as needed for pain Take diclofenac twice daily for pain as needed Follow up with sports medicine for pain as needed. See a regular doctor soon about blood pressure Come back or go to the emergency room if you notice new weakness new numbness problems walking or bowel or bladder problems.  Impingement Syndrome, Rotator Cuff, Bursitis with Rehab Impingement syndrome is a condition that involves inflammation of the tendons of the rotator cuff and the subacromial bursa, that causes pain in the shoulder. The rotator cuff consists of four tendons and muscles that control much of the shoulder and upper arm function. The subacromial bursa is a fluid filled sac that helps reduce friction between the rotator cuff and one of the bones of the shoulder (acromion). Impingement syndrome is usually an overuse injury that causes swelling of the bursa (bursitis), swelling of the tendon (tendonitis), and/or a tear of the tendon (strain). Strains are classified into three categories. Grade 1 strains cause pain, but the tendon is not lengthened. Grade 2 strains include a lengthened ligament, due to the ligament being stretched or partially ruptured. With grade 2 strains there is still function, although the function may be decreased. Grade 3 strains include a complete tear of the tendon or muscle, and function is usually impaired. SYMPTOMS   Pain around the shoulder, often at the outer portion of the upper arm.  Pain that gets worse with shoulder function, especially when reaching overhead or lifting.  Sometimes, aching when not using the arm.  Pain that wakes you up at night.  Sometimes, tenderness, swelling, warmth, or redness over the affected area.  Loss of strength.  Limited motion of the shoulder, especially reaching behind the back (to the back pocket or to unhook bra) or across your body.  Crackling sound (crepitation) when moving the  arm.  Biceps tendon pain and inflammation (in the front of the shoulder). Worse when bending the elbow or lifting. CAUSES  Impingement syndrome is often an overuse injury, in which chronic (repetitive) motions cause the tendons or bursa to become inflamed. A strain occurs when a force is paced on the tendon or muscle that is greater than it can withstand. Common mechanisms of injury include: Stress from sudden increase in duration, frequency, or intensity of training.  Direct hit (trauma) to the shoulder.  Aging, erosion of the tendon with normal use.  Bony bump on shoulder (acromial spur). RISK INCREASES WITH:  Contact sports (football, wrestling, boxing).  Throwing sports (baseball, tennis, volleyball).  Weightlifting and bodybuilding.  Heavy labor.  Previous injury to the rotator cuff, including impingement.  Poor shoulder strength and flexibility.  Failure to warm up properly before activity.  Inadequate protective equipment.  Old age.  Bony bump on shoulder (acromial spur). PREVENTION   Warm up and stretch properly before activity.  Allow for adequate recovery between workouts.  Maintain physical fitness:  Strength, flexibility, and endurance.  Cardiovascular fitness.  Learn and use proper exercise technique. PROGNOSIS  If treated properly, impingement syndrome usually goes away within 6 weeks. Sometimes surgery is required.  RELATED COMPLICATIONS   Longer healing time if not properly treated, or if not given enough time to heal.  Recurring symptoms, that result in a chronic condition.  Shoulder stiffness, frozen shoulder, or loss of motion.  Rotator cuff tendon tear.  Recurring symptoms, especially if activity is resumed too soon, with overuse, with a direct blow, or when using poor technique. TREATMENT  Treatment  first involves the use of ice and medicine, to reduce pain and inflammation. The use of strengthening and stretching exercises may help  reduce pain with activity. These exercises may be performed at home or with a therapist. If non-surgical treatment is unsuccessful after more than 6 months, surgery may be advised. After surgery and rehabilitation, activity is usually possible in 3 months.  MEDICATION  If pain medicine is needed, nonsteroidal anti-inflammatory medicines (aspirin and ibuprofen), or other minor pain relievers (acetaminophen), are often advised.  Do not take pain medicine for 7 days before surgery.  Prescription pain relievers may be given, if your caregiver thinks they are needed. Use only as directed and only as much as you need.  Corticosteroid injections may be given by your caregiver. These injections should be reserved for the most serious cases, because they may only be given a certain number of times. HEAT AND COLD  Cold treatment (icing) should be applied for 10 to 15 minutes every 2 to 3 hours for inflammation and pain, and immediately after activity that aggravates your symptoms. Use ice packs or an ice massage.  Heat treatment may be used before performing stretching and strengthening activities prescribed by your caregiver, physical therapist, or athletic trainer. Use a heat pack or a warm water soak. SEEK MEDICAL CARE IF:   Symptoms get worse or do not improve in 4 to 6 weeks, despite treatment.  New, unexplained symptoms develop. (Drugs used in treatment may produce side effects.) EXERCISES  RANGE OF MOTION (ROM) AND STRETCHING EXERCISES - Impingement Syndrome (Rotator Cuff  Tendinitis, Bursitis) These exercises may help you when beginning to rehabilitate your injury. Your symptoms may go away with or without further involvement from your physician, physical therapist or athletic trainer. While completing these exercises, remember:   Restoring tissue flexibility helps normal motion to return to the joints. This allows healthier, less painful movement and activity.  An effective stretch should  be held for at least 30 seconds.  A stretch should never be painful. You should only feel a gentle lengthening or release in the stretched tissue. STRETCH - Flexion, Standing  Stand with good posture. With an underhand grip on your right / left hand, and an overhand grip on the opposite hand, grasp a broomstick or cane so that your hands are a little more than shoulder width apart.  Keeping your right / left elbow straight and shoulder muscles relaxed, push the stick with your opposite hand, to raise your right / left arm in front of your body and then overhead. Raise your arm until you feel a stretch in your right / left shoulder, but before you have increased shoulder pain.  Try to avoid shrugging your right / left shoulder as your arm rises, by keeping your shoulder blade tucked down and toward your mid-back spine. Hold for __________ seconds.  Slowly return to the starting position. Repeat __________ times. Complete this exercise __________ times per day. STRETCH - Abduction, Supine  Lie on your back. With an underhand grip on your right / left hand and an overhand grip on the opposite hand, grasp a broomstick or cane so that your hands are a little more than shoulder width apart.  Keeping your right / left elbow straight and your shoulder muscles relaxed, push the stick with your opposite hand, to raise your right / left arm out to the side of your body and then overhead. Raise your arm until you feel a stretch in your right / left shoulder,  but before you have increased shoulder pain.  Try to avoid shrugging your right / left shoulder as your arm rises, by keeping your shoulder blade tucked down and toward your mid-back spine. Hold for __________ seconds.  Slowly return to the starting position. Repeat __________ times. Complete this exercise __________ times per day. ROM - Flexion, Active-Assisted  Lie on your back. You may bend your knees for comfort.  Grasp a broomstick or cane so  your hands are about shoulder width apart. Your right / left hand should grip the end of the stick, so that your hand is positioned "thumbs-up," as if you were about to shake hands.  Using your healthy arm to lead, raise your right / left arm overhead, until you feel a gentle stretch in your shoulder. Hold for __________ seconds.  Use the stick to assist in returning your right / left arm to its starting position. Repeat __________ times. Complete this exercise __________ times per day.  ROM - Internal Rotation, Supine   Lie on your back on a firm surface. Place your right / left elbow about 60 degrees away from your side. Elevate your elbow with a folded towel, so that the elbow and shoulder are the same height.  Using a broomstick or cane and your strong arm, pull your right / left hand toward your body until you feel a gentle stretch, but no increase in your shoulder pain. Keep your shoulder and elbow in place throughout the exercise.  Hold for __________ seconds. Slowly return to the starting position. Repeat __________ times. Complete this exercise __________ times per day. STRETCH - Internal Rotation  Place your right / left hand behind your back, palm up.  Throw a towel or belt over your opposite shoulder. Grasp the towel with your right / left hand.  While keeping an upright posture, gently pull up on the towel, until you feel a stretch in the front of your right / left shoulder.  Avoid shrugging your right / left shoulder as your arm rises, by keeping your shoulder blade tucked down and toward your mid-back spine.  Hold for __________ seconds. Release the stretch, by lowering your healthy hand. Repeat __________ times. Complete this exercise __________ times per day. ROM - Internal Rotation   Using an underhand grip, grasp a stick behind your back with both hands.  While standing upright with good posture, slide the stick up your back until you feel a mild stretch in the front  of your shoulder.  Hold for __________ seconds. Slowly return to your starting position. Repeat __________ times. Complete this exercise __________ times per day.  STRETCH - Posterior Shoulder Capsule   Stand or sit with good posture. Grasp your right / left elbow and draw it across your chest, keeping it at the same height as your shoulder.  Pull your elbow, so your upper arm comes in closer to your chest. Pull until you feel a gentle stretch in the back of your shoulder.  Hold for __________ seconds. Repeat __________ times. Complete this exercise __________ times per day. STRENGTHENING EXERCISES - Impingement Syndrome (Rotator Cuff Tendinitis, Bursitis) These exercises may help you when beginning to rehabilitate your injury. They may resolve your symptoms with or without further involvement from your physician, physical therapist or athletic trainer. While completing these exercises, remember:  Muscles can gain both the endurance and the strength needed for everyday activities through controlled exercises.  Complete these exercises as instructed by your physician, physical therapist or athletic trainer.  Increase the resistance and repetitions only as guided.  You may experience muscle soreness or fatigue, but the pain or discomfort you are trying to eliminate should never worsen during these exercises. If this pain does get worse, stop and make sure you are following the directions exactly. If the pain is still present after adjustments, discontinue the exercise until you can discuss the trouble with your clinician.  During your recovery, avoid activity or exercises which involve actions that place your injured hand or elbow above your head or behind your back or head. These positions stress the tissues which you are trying to heal. STRENGTH - Scapular Depression and Adduction   With good posture, sit on a firm chair. Support your arms in front of you, with pillows, arm rests, or on a  table top. Have your elbows in line with the sides of your body.  Gently draw your shoulder blades down and toward your mid-back spine. Gradually increase the tension, without tensing the muscles along the top of your shoulders and the back of your neck.  Hold for __________ seconds. Slowly release the tension and relax your muscles completely before starting the next repetition.  After you have practiced this exercise, remove the arm support and complete the exercise in standing as well as sitting position. Repeat __________ times. Complete this exercise __________ times per day.  STRENGTH - Shoulder Abductors, Isometric  With good posture, stand or sit about 4-6 inches from a wall, with your right / left side facing the wall.  Bend your right / left elbow. Gently press your right / left elbow into the wall. Increase the pressure gradually, until you are pressing as hard as you can, without shrugging your shoulder or increasing any shoulder discomfort.  Hold for __________ seconds.  Release the tension slowly. Relax your shoulder muscles completely before you begin the next repetition. Repeat __________ times. Complete this exercise __________ times per day.  STRENGTH - External Rotators, Isometric  Keep your right / left elbow at your side and bend it 90 degrees.  Step into a door frame so that the outside of your right / left wrist can press against the door frame without your upper arm leaving your side.  Gently press your right / left wrist into the door frame, as if you were trying to swing the back of your hand away from your stomach. Gradually increase the tension, until you are pressing as hard as you can, without shrugging your shoulder or increasing any shoulder discomfort.  Hold for __________ seconds.  Release the tension slowly. Relax your shoulder muscles completely before you begin the next repetition. Repeat __________ times. Complete this exercise __________ times per  day.  STRENGTH - Supraspinatus   Stand or sit with good posture. Grasp a __________ weight, or an exercise band or tubing, so that your hand is "thumbs-up," like you are shaking hands.  Slowly lift your right / left arm in a "V" away from your thigh, diagonally into the space between your side and straight ahead. Lift your hand to shoulder height or as far as you can, without increasing any shoulder pain. At first, many people do not lift their hands above shoulder height.  Avoid shrugging your right / left shoulder as your arm rises, by keeping your shoulder blade tucked down and toward your mid-back spine.  Hold for __________ seconds. Control the descent of your hand, as you slowly return to your starting position. Repeat __________ times. Complete this exercise __________  times per day.  STRENGTH - External Rotators  Secure a rubber exercise band or tubing to a fixed object (table, pole) so that it is at the same height as your right / left elbow when you are standing or sitting on a firm surface.  Stand or sit so that the secured exercise band is at your uninjured side.  Bend your right / left elbow 90 degrees. Place a folded towel or small pillow under your right / left arm, so that your elbow is a few inches away from your side.  Keeping the tension on the exercise band, pull it away from your body, as if pivoting on your elbow. Be sure to keep your body steady, so that the movement is coming only from your rotating shoulder.  Hold for __________ seconds. Release the tension in a controlled manner, as you return to the starting position. Repeat __________ times. Complete this exercise __________ times per day.  STRENGTH - Internal Rotators   Secure a rubber exercise band or tubing to a fixed object (table, pole) so that it is at the same height as your right / left elbow when you are standing or sitting on a firm surface.  Stand or sit so that the secured exercise band is at your  right / left side.  Bend your elbow 90 degrees. Place a folded towel or small pillow under your right / left arm so that your elbow is a few inches away from your side.  Keeping the tension on the exercise band, pull it across your body, toward your stomach. Be sure to keep your body steady, so that the movement is coming only from your rotating shoulder.  Hold for __________ seconds. Release the tension in a controlled manner, as you return to the starting position. Repeat __________ times. Complete this exercise __________ times per day.  STRENGTH - Scapular Protractors, Standing   Stand arms length away from a wall. Place your hands on the wall, keeping your elbows straight.  Begin by dropping your shoulder blades down and toward your mid-back spine.  To strengthen your protractors, keep your shoulder blades down, but slide them forward on your rib cage. It will feel as if you are lifting the back of your rib cage away from the wall. This is a subtle motion and can be challenging to complete. Ask your caregiver for further instruction, if you are not sure you are doing the exercise correctly.  Hold for __________ seconds. Slowly return to the starting position, resting the muscles completely before starting the next repetition. Repeat __________ times. Complete this exercise __________ times per day. STRENGTH - Scapular Protractors, Supine  Lie on your back on a firm surface. Extend your right / left arm straight into the air while holding a __________ weight in your hand.  Keeping your head and back in place, lift your shoulder off the floor.  Hold for __________ seconds. Slowly return to the starting position, and allow your muscles to relax completely before starting the next repetition. Repeat __________ times. Complete this exercise __________ times per day. STRENGTH - Scapular Protractors, Quadruped  Get onto your hands and knees, with your shoulders directly over your hands (or  as close as you can be, comfortably).  Keeping your elbows locked, lift the back of your rib cage up into your shoulder blades, so your mid-back rounds out. Keep your neck muscles relaxed.  Hold this position for __________ seconds. Slowly return to the starting position and allow your  muscles to relax completely before starting the next repetition. Repeat __________ times. Complete this exercise __________ times per day.  STRENGTH - Scapular Retractors  Secure a rubber exercise band or tubing to a fixed object (table, pole), so that it is at the height of your shoulders when you are either standing, or sitting on a firm armless chair.  With a palm down grip, grasp an end of the band in each hand. Straighten your elbows and lift your hands straight in front of you, at shoulder height. Step back, away from the secured end of the band, until it becomes tense.  Squeezing your shoulder blades together, draw your elbows back toward your sides, as you bend them. Keep your upper arms lifted away from your body throughout the exercise.  Hold for __________ seconds. Slowly ease the tension on the band, as you reverse the directions and return to the starting position. Repeat __________ times. Complete this exercise __________ times per day. STRENGTH - Shoulder Extensors   Secure a rubber exercise band or tubing to a fixed object (table, pole) so that it is at the height of your shoulders when you are either standing, or sitting on a firm armless chair.  With a thumbs-up grip, grasp an end of the band in each hand. Straighten your elbows and lift your hands straight in front of you, at shoulder height. Step back, away from the secured end of the band, until it becomes tense.  Squeezing your shoulder blades together, pull your hands down to the sides of your thighs. Do not allow your hands to go behind you.  Hold for __________ seconds. Slowly ease the tension on the band, as you reverse the directions  and return to the starting position. Repeat __________ times. Complete this exercise __________ times per day.  STRENGTH - Scapular Retractors and External Rotators   Secure a rubber exercise band or tubing to a fixed object (table, pole) so that it is at the height as your shoulders, when you are either standing, or sitting on a firm armless chair.  With a palm down grip, grasp an end of the band in each hand. Bend your elbows 90 degrees and lift your elbows to shoulder height, at your sides. Step back, away from the secured end of the band, until it becomes tense.  Squeezing your shoulder blades together, rotate your shoulders so that your upper arms and elbows remain stationary, but your fists travel upward to head height.  Hold for __________ seconds. Slowly ease the tension on the band, as you reverse the directions and return to the starting position. Repeat __________ times. Complete this exercise __________ times per day.  STRENGTH - Scapular Retractors and External Rotators, Rowing   Secure a rubber exercise band or tubing to a fixed object (table, pole) so that it is at the height of your shoulders, when you are either standing, or sitting on a firm armless chair.  With a palm down grip, grasp an end of the band in each hand. Straighten your elbows and lift your hands straight in front of you, at shoulder height. Step back, away from the secured end of the band, until it becomes tense.  Step 1: Squeeze your shoulder blades together. Bending your elbows, draw your hands to your chest, as if you are rowing a boat. At the end of this motion, your hands and elbow should be at shoulder height and your elbows should be out to your sides.  Step 2: Rotate your  shoulders, to raise your hands above your head. Your forearms should be vertical and your upper arms should be horizontal.  Hold for __________ seconds. Slowly ease the tension on the band, as you reverse the directions and return to  the starting position. Repeat __________ times. Complete this exercise __________ times per day.  STRENGTH - Scapular Depressors  Find a sturdy chair without wheels, such as a dining room chair.  Keeping your feet on the floor, and your hands on the chair arms, lift your bottom up from the seat, and lock your elbows.  Keeping your elbows straight, allow gravity to pull your body weight down. Your shoulders will rise toward your ears.  Raise your body against gravity by drawing your shoulder blades down your back, shortening the distance between your shoulders and ears. Although your feet should always maintain contact with the floor, your feet should progressively support less body weight, as you get stronger.  Hold for __________ seconds. In a controlled and slow manner, lower your body weight to begin the next repetition. Repeat __________ times. Complete this exercise __________ times per day.  Document Released: 01/11/2005 Document Revised: 04/05/2011 Document Reviewed: 04/25/2008 [facility] Patient Information 2015 [place], [place]. This information is not intended to replace advice given to you by your health care provider. Make sure you discuss any questions you have with your health care provider.

## 2014-03-13 NOTE — ED Provider Notes (Signed)
Nilsa NuttingKyok Huneycutt is a 44 y.o. male who presents to Urgent Care today for left shoulder pain and right neck pain present for days without injury. 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 He denies any radiating pain weakness or numbness. Left shoulder pain is located in the lateral upper arm and worse with overhand motion. The neck pain is located in the right trapezius area and worse with head motion. No weakness or numbness bowel bladder dysfunction or difficulty walking. Patient has tried ibuprofen which helps.   Past Medical History  Diagnosis Date  . Generalized headaches    History reviewed. No pertinent past surgical history. History  Substance Use Topics  . Smoking status: Never Smoker   . Smokeless tobacco: Not on file  . Alcohol Use: Not on file   ROS as above Medications: No current facility-administered medications for this encounter.   Current Outpatient Prescriptions  Medication Sig Dispense Refill  . cyclobenzaprine (FLEXERIL) 10 MG tablet Take 1 tablet (10 mg total) by mouth at bedtime as needed for muscle spasms. 20 tablet 0  . diclofenac (VOLTAREN) 50 MG EC tablet Take 1 tablet (50 mg total) by mouth 2 (two) times daily as needed. 30 tablet 0  . fluticasone (FLONASE) 50 MCG/ACT nasal spray Place 2 sprays into both nostrils daily. 16 g 2  . propranolol ER (INDERAL LA) 60 MG 24 hr capsule Take 1 capsule (60 mg total) by mouth daily. 30 capsule 3  . SUMAtriptan (IMITREX) 50 MG tablet Take 1 tablet (50 mg total) by mouth every 2 (two) hours as needed for migraine. 10 tablet 0   No Known Allergies   Exam:  BP 150/89 mmHg  Pulse 76  Temp(Src) 97.8 F (36.6 C) (Oral)  Resp 16  SpO2 97% Gen: Well NAD HEENT: EOMI,  MMM Lungs: Normal work of breathing. CTABL Heart: RRR no MRG Abd: NABS, Soft. Nondistended, Nontender Exts: Brisk capillary refill, warm and well perfused.  Neck: Nontender to spinal midline. Normal neck range of motion. Tender palpation right trapezius  negative Spurling's test. Upper extremity reflexes and strength are intact throughout. Left shoulder normal-appearing not significantly tender. Range of motion is normal with the exception of abduction limited to about 120 due to pain with actively and passively. Positive impingement testing. Strength is intact bilateral to all range of motion exam but painful with abduction. Negative Yergason's and speeds test. Right shoulder normal-appearing nontender normal motion normal strength negative impingement. Strength is intact bilateral. Sensation and pulses are equal and normal bilateral wrists.Subacromial Injection:  Left shoulder subacromial injection Consent obtained and time out performed.  Area cleaned with alcohol.  40Mg  of depomedrol and 3ml of 0.5% marcaine was injected into the subacromial bursa without complication or bleeding. Patient tolerated the procedure well.     No results found for this or any previous visit (from the past 24 hour(s)). No results found.  Assessment and Plan: 44 y.o. male with left shoulder impingement or subacromial bursitis treated with corticosteroid injection and NSAIDs. Home exercise program follow up with sports medicine. Additionally patient has right trapezius strain. Treat with Flexeril and NSAIDs follow up with sports medicine. Workup provided.  Discussed warning signs or symptoms. Please see discharge instructions. Patient expresses understanding.     Rodolph BongEvan S Jamaia Brum, MD 03/13/14 [phone]

## 2014-03-13 NOTE — ED Notes (Signed)
C/o right and left shoulder pain onset 4 days Right side radiates to neck Denies inj/trauma Works as a Freight forwarderroofer lifting heavy objects Alert, no signs of acute distress.

## 2014-08-23 ENCOUNTER — Emergency Department (HOSPITAL_COMMUNITY)
Admission: EM | Admit: 2014-08-23 | Discharge: 2014-08-23 | Disposition: A | Discharge status: Home / Self Care | Payer: BLUE CROSS/BLUE SHIELD | Attending: Emergency Medicine | Admitting: Emergency Medicine

## 2014-08-23 ENCOUNTER — Encounter (HOSPITAL_COMMUNITY): Payer: Self-pay | Admitting: Emergency Medicine

## 2014-08-23 ENCOUNTER — Emergency Department (HOSPITAL_COMMUNITY): Payer: BLUE CROSS/BLUE SHIELD

## 2014-08-23 DIAGNOSIS — R059 Cough, unspecified: Secondary | ICD-10-CM

## 2014-08-23 DIAGNOSIS — R509 Fever, unspecified: Secondary | ICD-10-CM | POA: Insufficient documentation

## 2014-08-23 DIAGNOSIS — R6883 Chills (without fever): Secondary | ICD-10-CM | POA: Diagnosis present

## 2014-08-23 DIAGNOSIS — R05 Cough: Secondary | ICD-10-CM

## 2014-08-23 LAB — COMPREHENSIVE METABOLIC PANEL
ALBUMIN: 3.9 g/dL (ref 3.5–5.0)
ALK PHOS: 99 U/L (ref 38–126)
ALT: 30 U/L (ref 17–63)
AST: 43 U/L — ABNORMAL HIGH (ref 15–41)
Anion gap: 9 (ref 5–15)
BILIRUBIN TOTAL: 0.9 mg/dL (ref 0.3–1.2)
BUN: 14 mg/dL (ref 6–20)
CALCIUM: 8.8 mg/dL — AB (ref 8.9–10.3)
CHLORIDE: 100 mmol/L — AB (ref 101–111)
CO2: 23 mmol/L (ref 22–32)
Creatinine, Ser: 1.01 mg/dL (ref 0.61–1.24)
GFR calc non Af Amer: >60 mL/min (ref >60)
Glucose, Bld: 76 mg/dL (ref 65–99)
Potassium: 4.2 mmol/L (ref 3.5–5.1)
SODIUM: 132 mmol/L — AB (ref 135–145)
Total Protein: 7.6 g/dL (ref 6.5–8.1)

## 2014-08-23 LAB — URINALYSIS, ROUTINE W REFLEX MICROSCOPIC
Bilirubin Urine: NEGATIVE
GLUCOSE, UA: NEGATIVE mg/dL
Hgb urine dipstick: NEGATIVE
Ketones, ur: NEGATIVE mg/dL
LEUKOCYTES UA: NEGATIVE
Nitrite: NEGATIVE
PROTEIN: NEGATIVE mg/dL
Specific Gravity, Urine: 1.009 (ref 1.005–1.030)
UROBILINOGEN UA: 0.2 mg/dL (ref 0.0–1.0)
pH: 7 (ref 5.0–8.0)

## 2014-08-23 LAB — CBC WITH DIFFERENTIAL/PLATELET
Basophils Absolute: 0 10*3/uL (ref 0.0–0.1)
Basophils Relative: 0 % (ref 0–1)
EOS PCT: 1 % (ref 0–5)
Eosinophils Absolute: 0.1 10*3/uL (ref 0.0–0.7)
HCT: 38.7 % — ABNORMAL LOW (ref 39.0–52.0)
HEMOGLOBIN: 12.5 g/dL — AB (ref 13.0–17.0)
LYMPHS PCT: 13 % (ref 12–46)
Lymphs Abs: 1.4 10*3/uL (ref 0.7–4.0)
MCH: 21.8 pg — ABNORMAL LOW (ref 26.0–34.0)
MCHC: 32.3 g/dL (ref 30.0–36.0)
MCV: 67.5 fL — AB (ref 78.0–100.0)
Monocytes Absolute: 1.4 10*3/uL — ABNORMAL HIGH (ref 0.1–1.0)
Monocytes Relative: 13 % — ABNORMAL HIGH (ref 3–12)
NEUTROS ABS: 7.5 10*3/uL (ref 1.7–7.7)
Neutrophils Relative %: 73 % (ref 43–77)
PLATELETS: 277 10*3/uL (ref 150–400)
RBC: 5.73 MIL/uL (ref 4.22–5.81)
RDW: 14.7 % (ref 11.5–15.5)
WBC: 10.4 10*3/uL (ref 4.0–10.5)

## 2014-08-23 LAB — I-STAT CG4 LACTIC ACID, ED: Lactic Acid, Venous: 0.74 mmol/L (ref 0.5–2.0)

## 2014-08-23 MED ORDER — SODIUM CHLORIDE 0.9 % IV BOLUS (SEPSIS)
1000.0000 mL | INTRAVENOUS | Status: AC
  Administered 2014-08-23 (×2): 1000 mL via INTRAVENOUS

## 2014-08-23 MED ORDER — ACETAMINOPHEN 325 MG PO TABS
325.0000 mg | ORAL_TABLET | Freq: Once | ORAL | Status: AC
  Administered 2014-08-23: 325 mg via ORAL
  Filled 2014-08-23: qty 1

## 2014-08-23 MED ORDER — ACETAMINOPHEN 325 MG PO TABS
650.0000 mg | ORAL_TABLET | Freq: Once | ORAL | Status: AC | PRN
  Administered 2014-08-23: 650 mg via ORAL
  Filled 2014-08-23: qty 2

## 2014-08-23 MED ORDER — SODIUM CHLORIDE 0.9 % IV BOLUS (SEPSIS)
500.0000 mL | INTRAVENOUS | Status: AC
  Administered 2014-08-23: 500 mL via INTRAVENOUS

## 2014-08-23 NOTE — ED Provider Notes (Signed)
CSN: [id number]     Arrival date & time 08/23/14  4098 History   First MD Initiated Contact with Patient 08/23/14 [phone]     Chief Complaint  Patient presents with  . Chills    The patient said he woke up with chills.  The patient's wife said this is the third night that he has woken up with "chills".      (Consider location/radiation/quality/duration/timing/severity/associated sxs/prior Treatment) HPI Patient is a 44 year old male with no significant past medical history presents the ER complaining of chills. Patient states her the past 3 nights he has been experiencing chills. Patient reports worsening of them are at the night last night, and came to the emergency room this morning. Patient denies any associated headaches, sore throat, nausea, vomiting, chest pain, shortness of breath, cough, abdominal pain, diarrhea, dysuria. Patient states his wife had similar symptoms last week.  Past Medical History  Diagnosis Date  . Generalized headaches    History reviewed. No pertinent past surgical history. History reviewed. No pertinent family history. History  Substance Use Topics  . Smoking status: Never Smoker   . Smokeless tobacco: Never Used  . Alcohol Use: No    Review of Systems  Constitutional: Positive for fever.  HENT: Negative for trouble swallowing.   Eyes: Negative for visual disturbance.  Respiratory: Negative for shortness of breath.   Cardiovascular: Negative for chest pain.  Gastrointestinal: Negative for nausea, vomiting and abdominal pain.  Genitourinary: Negative for dysuria.  Musculoskeletal: Negative for neck pain.  Skin: Negative for rash.  Neurological: Negative for dizziness, weakness and numbness.  Psychiatric/Behavioral: Negative.       Allergies  Review of patient's allergies indicates no known allergies.  Home Medications   Prior to Admission medications   Medication Sig Start Date End Date Taking? Authorizing Provider  cyclobenzaprine  (FLEXERIL) 10 MG tablet Take 1 tablet (10 mg total) by mouth at bedtime as needed for muscle spasms. Patient not taking: Reported on 08/23/2014 03/13/14   Rodolph Bong, MD  diclofenac (VOLTAREN) 50 MG EC tablet Take 1 tablet (50 mg total) by mouth 2 (two) times daily as needed. Patient not taking: Reported on 08/23/2014 03/13/14   Rodolph Bong, MD  fluticasone [facility]) 50 MCG/ACT nasal spray Place 2 sprays into both nostrils daily. Patient not taking: Reported on 08/23/2014 07/03/13   Rodolph Bong, MD  propranolol ER (INDERAL LA) 60 MG 24 hr capsule Take 1 capsule (60 mg total) by mouth daily. Patient not taking: Reported on 08/23/2014 11/26/11   Reuben Likes, MD  SUMAtriptan (IMITREX) 50 MG tablet Take 1 tablet (50 mg total) by mouth every 2 (two) hours as needed for migraine. Patient not taking: Reported on 08/23/2014 11/26/11   Reuben Likes, MD   BP 115/71 mmHg  Pulse 68  Temp(Src) 98 F (36.7 C) (Oral)  Resp 18  Ht 5\' 2"  (1.575 m)  Wt 160 lb (72.576 kg)  BMI 29.26 kg/m2  SpO2 98% Physical Exam  Constitutional: He is oriented to person, place, and time. He appears well-developed and well-nourished. No distress.  HENT:  Head: Normocephalic and atraumatic.  Mouth/Throat: Uvula is midline and oropharynx is clear and moist. No trismus in the jaw. No uvula swelling. No oropharyngeal exudate, posterior oropharyngeal edema, posterior oropharyngeal erythema or tonsillar abscesses.  Eyes: Right eye exhibits no discharge. Left eye exhibits no discharge. No scleral icterus.  Neck: Normal range of motion and full passive range of motion without pain. Neck supple. No  spinous process tenderness and no muscular tenderness present. No rigidity. No edema, no erythema and normal range of motion present. No Brudzinski's sign and no Kernig's sign noted.  Cardiovascular: Normal rate, regular rhythm and normal heart sounds.   No murmur heard. Pulmonary/Chest: Effort normal and breath sounds normal. No  respiratory distress.  Abdominal: Soft. There is no tenderness.  Genitourinary: Rectum normal and prostate normal. Rectal exam shows no external hemorrhoid, no internal hemorrhoid, no fissure, no mass, no tenderness and anal tone normal. Prostate is not enlarged and not tender.  Rectal tone normal. Prostate nonenlarged, nontender. Small amount of brown colored stool noted in rectal vault. Chaperone present during entire GU exam.  Musculoskeletal: Normal range of motion. He exhibits no edema or tenderness.  Neurological: He is alert and oriented to person, place, and time. He has normal strength. No cranial nerve deficit or sensory deficit. Coordination normal. GCS eye subscore is 4. GCS verbal subscore is 5. GCS motor subscore is 6.  Patient fully alert, answering questions appropriately in full, clear sentences. Cranial nerves II through XII grossly intact. Motor strength 5 out of 5 in all major muscle groups of upper and lower extremities. Distal sensation intact.   Skin: Skin is warm and dry. No rash noted. He is not diaphoretic.  Psychiatric: He has a normal mood and affect.  Nursing note and vitals reviewed.   ED Course  Procedures (including critical care time) Labs Review Labs Reviewed  COMPREHENSIVE METABOLIC PANEL - Abnormal; Notable for the following:    Sodium 132 (*)    Chloride 100 (*)    Calcium 8.8 (*)    AST 43 (*)    All other components within normal limits  CBC WITH DIFFERENTIAL/PLATELET - Abnormal; Notable for the following:    Hemoglobin 12.5 (*)    HCT 38.7 (*)    MCV 67.5 (*)    MCH 21.8 (*)    Monocytes Relative 13 (*)    Monocytes Absolute 1.4 (*)    All other components within normal limits  CULTURE, BLOOD (ROUTINE X 2)  CULTURE, BLOOD (ROUTINE X 2)  URINE CULTURE  URINALYSIS, ROUTINE W REFLEX MICROSCOPIC (NOT AT [facility])  I-STAT CG4 LACTIC ACID, ED    Imaging Review Dg Chest 2 View  08/23/2014   CLINICAL DATA:  Cough for 3 days.  EXAM: CHEST  2 VIEW   COMPARISON:  Chest radiograph July 03, 2013  FINDINGS: Cardiomediastinal silhouette is unremarkable. The lungs are clear without pleural effusions or focal consolidations. Trachea projects midline and there is no pneumothorax. Soft tissue planes and included osseous structures are non-suspicious. Mild degenerative change of the thoracic spine.  IMPRESSION: No acute cardiopulmonary process.   Electronically Signed   By: Awilda Metro M.D.   On: 08/23/2014 05:59     EKG Interpretation None      MDM   Final diagnoses:  Fever and chills    Patient here with fever of unknown origin. Patient's exam overall is benign. Patient's only complaints are chills over the past several days. Lab work and imaging are unremarkable for acute pathology, there is no obvious source of patient's fever. Rectal exam is benign, no concern for prostatitis. Urinalysis unremarkable for UTI. No concern for pneumonia on chest x-ray. No leukocytosis or anemia. Abdominal exam is benign. Lactic acid 0.74. Fever was treated here, decreased to 99. Patient rehydrated with IV fluids. Patient tolerating by mouth well. No concern for sepsis or SIRS. No meningeal signs or concern for meningitis.  No concern for strep pharyngitis based on history or exam. Patient afebrile, hemodynamically stable and in no acute distress. Patient stable for discharge. Strongly encouraged patient to follow up with a PCP, gave resource guide to help find one. Return precautions discussed, patient verbalizes understanding and agreement of this plan.  BP 115/71 mmHg  Pulse 68  Temp(Src) 98 F (36.7 C) (Oral)  Resp 18  Ht 5\' 2"  (1.575 m)  Wt 160 lb (72.576 kg)  BMI 29.26 kg/m2  SpO2 98%  Signed,  Ladona Mow, PA-C 10:13 AM  Patient discussed with Dr. Nelva Nay, MD    Ladona Mow, PA-C 08/23/14 1005  Ladona Mow, PA-C 08/23/14 1013  Nelva Nay, MD 08/24/14 [phone]

## 2014-08-23 NOTE — Discharge Instructions (Signed)
Follow-up with a primary care doctor. Refer to resource guide below. You may alternate ibuprofen and Tylenol every 3-4 hours for fever and chills. Return to the ER if any severe pain, difficulty breathing, difficulty urinating, dizziness, weakness. Drink plenty of fluids including water and Gatorade, eat small, bland meals.  Fever, Adult A fever is a higher than normal body temperature. In an adult, an oral temperature around 98.6 F (37 C) is considered normal. A temperature of 100.4 F (38 C) or higher is generally considered a fever. Mild or moderate fevers generally have no long-term effects and often do not require treatment. Extreme fever (greater than or equal to 106 F or 41.1 C) can cause seizures. The sweating that may occur with repeated or prolonged fever may cause dehydration. Elderly people can develop confusion during a fever. A measured temperature can vary with:  Age.  Time of day.  Method of measurement (mouth, underarm, rectal, or ear). The fever is confirmed by taking a temperature with a thermometer. Temperatures can be taken different ways. Some methods are accurate and some are not.  An oral temperature is used most commonly. Electronic thermometers are fast and accurate.  An ear temperature will only be accurate if the thermometer is positioned as recommended by the manufacturer.  A rectal temperature is accurate and done for those adults who have a condition where an oral temperature cannot be taken.  An underarm (axillary) temperature is not accurate and not recommended. Fever is a symptom, not a disease.  CAUSES   Infections commonly cause fever.  Some noninfectious causes for fever include:  Some arthritis conditions.  Some thyroid or adrenal gland conditions.  Some immune system conditions.  Some types of cancer.  A medicine reaction.  High doses of certain street drugs such as methamphetamine.  Dehydration.  Exposure to high outside or room  temperatures.  Occasionally, the source of a fever cannot be determined. This is sometimes called a "fever of unknown origin" (FUO).  Some situations may lead to a temporary rise in body temperature that may go away on its own. Examples are:  Childbirth.  Surgery.  Intense exercise. HOME CARE INSTRUCTIONS   Take appropriate medicines for fever. Follow dosing instructions carefully. If you use acetaminophen to reduce the fever, be careful to avoid taking other medicines that also contain acetaminophen. Do not take aspirin for a fever if you are younger than age 11. There is an association with Reye's syndrome. Reye's syndrome is a rare but potentially deadly disease.  If an infection is present and antibiotics have been prescribed, take them as directed. Finish them even if you start to feel better.  Rest as needed.  Maintain an adequate fluid intake. To prevent dehydration during an illness with prolonged or recurrent fever, you may need to drink extra fluid.Drink enough fluids to keep your urine clear or pale yellow.  Sponging or bathing with room temperature water may help reduce body temperature. Do not use ice water or alcohol sponge baths.  Dress comfortably, but do not over-bundle. SEEK MEDICAL CARE IF:   You are unable to keep fluids down.  You develop vomiting or diarrhea.  You are not feeling at least partly better after 3 days.  You develop new symptoms or problems. SEEK IMMEDIATE MEDICAL CARE IF:   You have shortness of breath or trouble breathing.  You develop excessive weakness.  You are dizzy or you faint.  You are extremely thirsty or you are making little or no urine.  You develop new pain that was not there before (such as in the head, neck, chest, back, or abdomen).  You have persistent vomiting and diarrhea for more than 1 to 2 days.  You develop a stiff neck or your eyes become sensitive to light.  You develop a skin rash.  You have a fever or  persistent symptoms for more than 2 to 3 days.  You have a fever and your symptoms suddenly get worse. MAKE SURE YOU:   Understand these instructions.  Will watch your condition.  Will get help right away if you are not doing well or get worse. Document Released: 07/07/2000 Document Revised: 05/28/2013 Document Reviewed: 11/12/2010 [facility] Patient Information 2015 [place], [place]. This information is not intended to replace advice given to you by your health care provider. Make sure you discuss any questions you have with your health care provider.  Although you have not been having diarrhea, these food choices may help alleviate the symptoms you have been experiencing.  Food Choices to Help Relieve Diarrhea When you have diarrhea, the foods you eat and your eating habits are very important. Choosing the right foods and drinks can help relieve diarrhea. Also, because diarrhea can last up to 7 days, you need to replace lost fluids and electrolytes (such as sodium, potassium, and chloride) in order to help prevent dehydration.  WHAT GENERAL GUIDELINES DO I NEED TO FOLLOW?  Slowly drink 1 cup (8 oz) of fluid for each episode of diarrhea. If you are getting enough fluid, your urine will be clear or pale yellow.  Eat starchy foods. Some good choices include white rice, white toast, pasta, low-fiber cereal, baked potatoes (without the skin), saltine crackers, and bagels.  Avoid large servings of any cooked vegetables.  Limit fruit to two servings per day. A serving is  cup or 1 small piece.  Choose foods with less than 2 g of fiber per serving.  Limit fats to less than 8 tsp (38 g) per day.  Avoid fried foods.  Eat foods that have probiotics in them. Probiotics can be found in certain dairy products.  Avoid foods and beverages that may increase the speed at which food moves through the stomach and intestines (gastrointestinal tract). Things to avoid include:  High-fiber foods, such  as dried fruit, raw fruits and vegetables, nuts, seeds, and whole grain foods.  Spicy foods and high-fat foods.  Foods and beverages sweetened with high-fructose corn syrup, honey, or sugar alcohols such as xylitol, sorbitol, and mannitol. WHAT FOODS ARE RECOMMENDED? Grains White rice. White, [place], or pita breads (fresh or toasted), including plain rolls, buns, or bagels. White pasta. Saltine, soda, or graham crackers. Pretzels. Low-fiber cereal. Cooked cereals made with water (such as cornmeal, farina, or cream cereals). Plain muffins. Matzo. Melba toast. Zwieback.  Vegetables Potatoes (without the skin). Strained tomato and vegetable juices. Most well-cooked and canned vegetables without seeds. Tender lettuce. Fruits Cooked or canned applesauce, apricots, cherries, fruit cocktail, grapefruit, peaches, pears, or plums. Fresh bananas, apples without skin, cherries, grapes, cantaloupe, grapefruit, peaches, oranges, or plums.  Meat and Other Protein Products Baked or boiled chicken. Eggs. Tofu. Fish. Seafood. Smooth peanut butter. Ground or well-cooked tender beef, ham, veal, lamb, pork, or poultry.  Dairy Plain yogurt, kefir, and unsweetened liquid yogurt. Lactose-free milk, buttermilk, or soy milk. Plain hard cheese. Beverages Sport drinks. Clear broths. Diluted fruit juices (except prune). Regular, caffeine-free sodas such as ginger ale. Water. Decaffeinated teas. Oral rehydration solutions. Sugar-free beverages not sweetened with  sugar alcohols. Other Bouillon, broth, or soups made from recommended foods.  The items listed above may not be a complete list of recommended foods or beverages. Contact your dietitian for more options. WHAT FOODS ARE NOT RECOMMENDED? Grains Whole grain, whole wheat, bran, or rye breads, rolls, pastas, crackers, and cereals. Wild or brown rice. Cereals that contain more than 2 g of fiber per serving. Corn tortillas or taco shells. Cooked or dry oatmeal. Granola.  Popcorn. Vegetables Raw vegetables. Cabbage, broccoli, Brussels sprouts, artichokes, baked beans, beet greens, corn, kale, legumes, peas, sweet potatoes, and yams. Potato skins. Cooked spinach and cabbage. Fruits Dried fruit, including raisins and dates. Raw fruits. Stewed or dried prunes. Fresh apples with skin, apricots, mangoes, pears, raspberries, and strawberries.  Meat and Other Protein Products Chunky peanut butter. Nuts and seeds. Beans and lentils. Tomasa Blase.  Dairy High-fat cheeses. Milk, chocolate milk, and beverages made with milk, such as milk shakes. Cream. Ice cream. Sweets and Desserts Sweet rolls, doughnuts, and sweet breads. Pancakes and waffles. Fats and Oils Butter. Cream sauces. Margarine. Salad oils. Plain salad dressings. Olives. Avocados.  Beverages Caffeinated beverages (such as coffee, tea, soda, or energy drinks). Alcoholic beverages. Fruit juices with pulp. Prune juice. Soft drinks sweetened with high-fructose corn syrup or sugar alcohols. Other Coconut. Hot sauce. Chili powder. Mayonnaise. Gravy. Cream-based or milk-based soups.  The items listed above may not be a complete list of foods and beverages to avoid. Contact your dietitian for more information. WHAT SHOULD I DO IF I BECOME DEHYDRATED? Diarrhea can sometimes lead to dehydration. Signs of dehydration include dark urine and dry mouth and skin. If you think you are dehydrated, you should rehydrate with an oral rehydration solution. These solutions can be purchased at pharmacies, retail stores, or online.  Drink -1 cup (120-240 mL) of oral rehydration solution each time you have an episode of diarrhea. If drinking this amount makes your diarrhea worse, try drinking smaller amounts more often. For example, drink 1-3 tsp (5-15 mL) every 5-10 minutes.  A general rule for staying hydrated is to drink 1-2 L of fluid per day. Talk to your health care provider about the specific amount you should be drinking each day.  Drink enough fluids to keep your urine clear or pale yellow. Document Released: 04/03/2003 Document Revised: 01/16/2013 Document Reviewed: 12/04/2012 [facility] Patient Information 2015 [place], [place]. This information is not intended to replace advice given to you by your health care provider. Make sure you discuss any questions you have with your health care provider.   Emergency Department Resource Guide 1) Find a Doctor and Pay Out of Pocket Although you won't have to find out who is covered by your insurance plan, it is a good idea to ask around and get recommendations. You will then need to call the office and see if the doctor you have chosen will accept you as a new patient and what types of options they offer for patients who are self-pay. Some doctors offer discounts or will set up payment plans for their patients who do not have insurance, but you will need to ask so you aren't surprised when you get to your appointment.  2) Contact Your Local Health Department Not all health departments have doctors that can see patients for sick visits, but many do, so it is worth a call to see if yours does. If you don't know where your local health department is, you can check in your phone book. The CDC also has a tool  to help you locate your state's health department, and many state websites also have listings of all of their local health departments.  3) Find a Walk-in Clinic If your illness is not likely to be very severe or complicated, you may want to try a walk in clinic. These are popping up all over the country in pharmacies, drugstores, and shopping centers. They're usually staffed by nurse practitioners or physician assistants that have been trained to treat common illnesses and complaints. They're usually fairly quick and inexpensive. However, if you have serious medical issues or chronic medical problems, these are probably not your best option.  No Primary Care Doctor: - Call Health  Connect at  [phone] - they can help you locate a primary care doctor that  accepts your insurance, provides certain services, etc. - Physician Referral Service- [phone]  Chronic Pain Problems: Organization         Address  Phone   Notes  [facility]  [phone] Patients need to be referred by their primary care doctor.   Medication Assistance: Organization         Address  Phone   Notes  [facility] Medication [facility] [address] [place], [place] [address] [phone] --Must be a resident of [facility] -- Must have NO insurance coverage whatsoever (no Medicaid/ Medicare, etc.) -- The pt. MUST have a primary care doctor that directs their care regularly and follows them in the community   [facility]ist  [phone]   [employer]  [phone]    Agencies that provide inexpensive medical care: Organization         Address  Phone   Notes  [facility]  [phone]   [facility]    [phone]   [facility] [address] [place], [place] [address] [phone]   [facility] [address] [place], [place] [phone]   Planned Parenthood    [phone]   [facility]    [phone]   [facility]  [address], [place] Phone:  [phone], Fax:  [phone] Hours of Operation:  9 am - 6 pm, M-F.  Also accepts Medicaid/Medicare and self-pay.  [facility]  [address], [place] Phone: [phone], Fax: [phone]. Hours of Operation:  8:30 am - 5:30 pm, M-F.  Also accepts Medicaid and self-pay.  [facility] [address], [place] Phone: [phone]   [facility] [address] Natasha Bence [place], [place] [phone], Ext. [phone] Mondays & Thursdays: 7-9 AM.  First 15 patients are seen on a first come, first serve basis.     Medicaid-accepting [facility] Providers:  Organization         Address  Phone   Notes  [facility] [address], [place] [phone] Also accepts self-pay patients.  [facility] [address] Laurell Josephs [place], [place]  [phone]   [facility] [address], [place] [phone]   [facility] [address] [place] [phone]   Renaye Rakers [address], [place]   [phone] Only accepts [place] Access [place] patients after they have their name applied to their card.   Self-Pay (no insurance) in [place]:  Organization         Address  Phone   Notes  Sickle Cell Patients, [facility] [place] [phone]   [facility] [place] [phone]   [facility] [place]  [address], [place] [phone]   [facility]/Dr. Osei-Bonsu  [address], [place] or [address], [place] [phone] Phone number for both [place] and [place] locations is the same.  [facility] [address] [place] [phone]   [facility] [address], [place] or [address] [phone]   [facility] [address], [place] [phone], phone; [phone], fax Sees patients 1st and 3rd Saturday of every month.  Must not qualify for public or private insurance (i.e. Medicaid, Medicare, [place] Health Choice, Veterans' Benefits)  Household income should be no more than 200% of the poverty level The clinic cannot treat you if you are pregnant or think you are pregnant  Sexually transmitted diseases are not treated at the clinic.    Dental Care: Organization         Address  Phone  Notes  [facility]  Department of [facility] [place] [phone] Accepts children up to age 98 who are enrolled in [place] or [place]; pregnant women with a Medicaid card; and children who have applied for Medicaid or [place] Health Choice, but were declined, whose parents can pay a reduced fee at time of service.  [facility] Department of [facility]  [address] [place] [address], [place] [phone] Accepts children up to age 50 who are enrolled in [place] or [place]; pregnant women with a Medicaid card; and children who have applied for Medicaid or  Health Choice, but were declined, whose parents can pay a reduced fee at time of service.  [facility]  [place] [phone] Patients are seen by appointment only. Walk-ins are not accepted. [place] will see patients 33 years of age and older. Monday - Tuesday (8am-5pm) Most Wednesdays (8:30-5pm) $30 per visit, cash only  [facility] Adult Dental Access PRO[facility]  [address], [facility] [phone] Patients are seen by appointment only. Walk-ins are not accepted. [place] will see patients 24 years of age and older. One Wednesday Evening (Monthly: Volunteer Based).  $30 per visit, cash only  [place]  [phone] for adults; Children under age 3, call Graduate Pediatric Dentistry at [phone]. Children aged 20-14, please call [phone] to request a pediatric application.  Dental services are provided in all areas of dental care including fillings, crowns and bridges, complete and partial dentures, implants, gum treatment, root canals, and extractions. Preventive care is also provided. Treatment is provided to both adults and children. Patients are selected via a lottery and there is often a waiting list.   [facility] [address] [place]  [phone]  [email or web address]   [facility] [address] [place], [place] [phone], Ext. [phone] Second and Fourth Thursday of each month, opens at 6:30 AM; Clinic ends at 9 AM.  Patients are seen on a first-come first-served basis, and a limited number are seen during each clinic.  [facility]  [address] Hillard Danker [place], [place] [phone]   Eligibility Requirements You must have lived in [place], [place], or [place] counties for at least the last three months.   You cannot be eligible for state or federal sponsored [employer], including [employer], [place], or [employer].   You generally cannot be eligible for healthcare insurance through your employer.    How to apply: Eligibility screenings are held every Tuesday and Wednesday afternoon from 1:00 pm until 4:00 pm. You do not need an appointment for the interview!  [facility] [address], [place], [place]   [place]  [place] Department  [place]  [phone]    Behavioral Health Resources in the Community: Intensive Outpatient Programs Organization         Address  Phone  Notes  [place]. [address] [place], [place] [phone]   [facility]patient [address], [place], [place]   [facility]: [facility] [address] [place], [place]   [place] [address]  [place], [place] or [phone]   Substance Abuse Resources Organization         Address  Phone  Notes  [facility] Drug Services  [phone]   [place]  [phone]   [facility]   Chinita Pester  [phone]   Residential & Outpatient Substance Abuse Program  [phone]   Psychological Services Organization          Address  Phone  Notes  [facility] [place]  [place]  [phone]   [place] [address], [place] or [phone]    Mobile Crisis Teams Organization         Address  Phone  Notes  [facility], Mobile Crisis Care Unit  [phone]   [facility]  [address] [place], [place]   [place] Levels [address] [place] [phone]    Self-Help/Support Groups Organization         Address  Phone             Notes  [place]. of [place] - variety of support groups  Carney Call for more information  Narcotics Anonymous (NA), [facility] [address], [employer] [place]  2 meetings at this location   Special educational needs teacher         Address  Phone  Notes  [facility] [place],    [place]  [phone]   [facility]  [address], [place] [id number], [place], [place]   [address] [place], [place] [phone] Admissions: 8am-3pm M-F  Incentives Substance [place] [address],    [place], [place] [phone]   [facility] [address] Jadene Pierini [place], [place]   [facility] [address],  [place], [place]   [facility] - Intensive Outpatient [address] Kristeen [address], [place], [place]   [facility] (Rea.) [address].,  [place], [place] or [phone]   [facility] (RT[facility]) [address] [place], [place] Accepts Medicaid  [facility] [place] [address],  [place] [phone] Substance Abuse/Addiction Treatment  [facility] Behavioral Health Resources Organization         Address  Phone  Notes  [facility]  [phone]   Angie FavaJulie Brannon, PhD [address] [place], [place]   [phone]) [phone] or [phone](336) [phone]   [facility]   [address] [place], [place] [phone]) [phone]   [facility] [address], [place], [place] [phone]) [phone] Insurance/Medicaid/sponsorship through [facility]  [facility]ith and Families [address]                                    [place], [place] [phone]) [phone] Therapy/tele-psych/case  [facility] [address].   [place], [place] [phone]) [phone]    Dr. Lolly MustacheArfeen  [phone]) [phone]   [facility]  [employer] [facility] 1) [address], [place] 2) [address], [place] 3)  [address], [place] [phone](336) [phone]) [phone]  [phone]) [phone]   [facility] Child Abuse Hotline [phone](336) [phone] or [phone](336) [phone] (After Hours)

## 2014-08-23 NOTE — ED Notes (Signed)
The patient said he woke up with chills.  The patient's wife said this is the third night that he has woken up with "chills".   The patient's wife says he denies pain, and says he has been having a non-productive cough.

## 2014-08-24 LAB — URINE CULTURE

## 2014-08-28 LAB — CULTURE, BLOOD (ROUTINE X 2)
Culture: NO GROWTH
Culture: NO GROWTH

## 2014-10-05 ENCOUNTER — Emergency Department (INDEPENDENT_AMBULATORY_CARE_PROVIDER_SITE_OTHER): Payer: Worker's Compensation

## 2014-10-05 ENCOUNTER — Encounter (HOSPITAL_COMMUNITY): Payer: Self-pay | Admitting: *Deleted

## 2014-10-05 ENCOUNTER — Emergency Department (INDEPENDENT_AMBULATORY_CARE_PROVIDER_SITE_OTHER)
Admission: EM | Admit: 2014-10-05 | Discharge: 2014-10-05 | Disposition: A | Discharge status: Home / Self Care | Payer: Worker's Compensation | Source: Home / Self Care | Attending: Family Medicine | Admitting: Family Medicine

## 2014-10-05 DIAGNOSIS — S9031XA Contusion of right foot, initial encounter: Secondary | ICD-10-CM | POA: Diagnosis not present

## 2014-10-05 DIAGNOSIS — R52 Pain, unspecified: Secondary | ICD-10-CM

## 2014-10-05 MED ORDER — HYDROCODONE-ACETAMINOPHEN 5-325 MG PO TABS: 1.0000 | ORAL_TABLET | Freq: Four times a day (QID) | ORAL | Status: DC | PRN

## 2014-10-05 MED ORDER — NAPROXEN 375 MG PO TABS: 375.0000 mg | ORAL_TABLET | Freq: Two times a day (BID) | ORAL | Status: DC

## 2014-10-05 NOTE — Discharge Instructions (Signed)
Thank you for coming in today. Follow up with occupational health clinic Monday.  Use the post op shoe as needed.  Take naproxen during the day and take norco at night for pain as needed.   Contusion A contusion is the result of an injury to the skin and underlying tissues and is usually caused by direct trauma. The injury results in the appearance of a bruise on the skin overlying the injured tissues. Contusions cause rupture and bleeding of the small capillaries and blood vessels and affect function, because the bleeding infiltrates muscles, tendons, nerves, or other soft tissues.  SYMPTOMS   Swelling and often a hard lump in the injured area, either superficial or deep.  Pain and tenderness over the area of the contusion.  Feeling of firmness when pressure is exerted over the contusion.  Discoloration under the skin, beginning with redness and progressing to the characteristic "black and blue" bruise. CAUSES  A contusion is typically the result of direct trauma. This is often by a blunt object.  RISK INCREASES WITH:  Sports that have a high likelihood of trauma (football, boxing, ice hockey, soccer, field hockey, martial arts, basketball, and baseball).  Sports that make falling from a height likely (high-jumping, pole-vaulting, skating, or gymnastics).  Any bleeding disorder (hemophilia) or taking medications that affect clotting (aspirin, nonsteroidal anti-inflammatory medications, or warfarin [Coumadin]).  Inadequate protection of exposed areas during contact sports. PREVENTION  Maintain physical fitness:  Joint and muscle flexibility.  Strength and endurance.  Coordination.  Wear proper protective equipment. Make sure it fits correctly. PROGNOSIS  Contusions typically heal without any complications. Healing time varies with the severity of injury and intake of medications that affect clotting. Contusions usually heal in 1 to 4 weeks. RELATED COMPLICATIONS   Damage to  nearby nerves or blood vessels, causing numbness, coldness, or paleness.  Compartment syndrome.  Bleeding into the soft tissues that leads to disability.  Infiltrative-type bleeding, leading to the calcification and impaired function of the injured muscle (rare).  Prolonged healing time if usual activities are resumed too soon.  Infection if the skin over the injury site is broken.  Fracture of the bone underlying the contusion.  Stiffness in the joint where the injured muscle crosses. TREATMENT  Treatment initially consists of resting the injured area as well as medication and ice to reduce inflammation. The use of a compression bandage may also be helpful in minimizing inflammation. As pain diminishes and movement is tolerated, the joint where the affected muscle crosses should be moved to prevent stiffness and the shortening (contracture) of the joint. Movement of the joint should begin as soon as possible. It is also important to work on maintaining strength within the affected muscles. Occasionally, extra padding over the area of contusion may be recommended before returning to sports, particularly if re-injury is likely.  MEDICATION   If pain relief is necessary these medications are often recommended:  Nonsteroidal anti-inflammatory medications, such as aspirin and ibuprofen.  Other minor pain relievers, such as acetaminophen, are often recommended.  Prescription pain relievers may be given by your caregiver. Use only as directed and only as much as you need. HEAT AND COLD  Cold treatment (icing) relieves pain and reduces inflammation. Cold treatment should be applied for 10 to 15 minutes every 2 to 3 hours for inflammation and pain and immediately after any activity that aggravates your symptoms. Use ice packs or an ice massage. (To do an ice massage fill a large styrofoam cup with water  and freeze. Tear a small amount of foam from the top so ice protrudes. Massage ice firmly  over the injured area in a circle about the size of a softball.)  Heat treatment may be used prior to performing the stretching and strengthening activities prescribed by your caregiver, physical therapist, or athletic trainer. Use a heat pack or a warm soak. SEEK MEDICAL CARE IF:   Symptoms get worse or do not improve despite treatment in a few days.  You have difficulty moving a joint.  Any extremity becomes extremely painful, numb, pale, or cool (This is an emergency!).  Medication produces any side effects (bleeding, upset stomach, or allergic reaction).  Signs of infection (drainage from skin, headache, muscle aches, dizziness, fever, or general ill feeling) occur if skin was broken. Document Released: 01/11/2005 Document Revised: 04/05/2011 Document Reviewed: 04/25/2008 [facility] Patient Information 2015 [place], [place]. This information is not intended to replace advice given to you by your health care provider. Make sure you discuss any questions you have with your health care provider.

## 2014-10-05 NOTE — ED Provider Notes (Signed)
Danny Kim is a 44 y.o. male who presents to Urgent Care today for right foot injury. Patient has pain and swelling of the right foot. He dropped a heavy object on his foot yesterday at work. He was wearing stilted boots from the object hit just proximal to the end of the steel toes. He notes pain and swelling. The pain is worse with ambulation. He has tried Aleve which helps a little. No fevers chills nausea vomiting or diarrhea.   Past Medical History  Diagnosis Date  . Generalized headaches    History reviewed. No pertinent past surgical history. Social History  Substance Use Topics  . Smoking status: Never Smoker   . Smokeless tobacco: Never Used  . Alcohol Use: No   ROS as above Medications: No current facility-administered medications for this encounter.   Current Outpatient Prescriptions  Medication Sig Dispense Refill  . HYDROcodone-acetaminophen (NORCO/VICODIN) 5-325 MG per tablet Take 1 tablet by mouth every 6 (six) hours as needed. 15 tablet 0  . naproxen (NAPROSYN) 375 MG tablet Take 1 tablet (375 mg total) by mouth 2 (two) times daily. 20 tablet 0  . [DISCONTINUED] fluticasone (FLONASE) 50 MCG/ACT nasal spray Place 2 sprays into both nostrils daily. (Patient not taking: Reported on 08/23/2014) 16 g 2  . [DISCONTINUED] propranolol ER (INDERAL LA) 60 MG 24 hr capsule Take 1 capsule (60 mg total) by mouth daily. (Patient not taking: Reported on 08/23/2014) 30 capsule 3  . [DISCONTINUED] SUMAtriptan (IMITREX) 50 MG tablet Take 1 tablet (50 mg total) by mouth every 2 (two) hours as needed for migraine. (Patient not taking: Reported on 08/23/2014) 10 tablet 0   No Known Allergies   Exam:  BP 153/103 mmHg  Pulse 74  Temp(Src) 98.2 F (36.8 C) (Oral)  Resp 20  SpO2 100% Gen: Well NAD HEENT: EOMI,  MMM Lungs: Normal work of breathing. CTABL Heart: RRR no MRG Abd: NABS, Soft. Nondistended, Nontender Exts: Brisk capillary refill, warm and well perfused.  Right foot swollen and  tender especially at the dorsal first and second MTPs. Pulses capillary refill sensation intact.  No results found for this or any previous visit (from the past 24 hour(s)). Dg Foot Complete Right  10/05/2014   CLINICAL DATA:  Crush injury, pain at the first metatarsophalangeal joint  EXAM: RIGHT FOOT COMPLETE - 3+ VIEW  COMPARISON:  None.  FINDINGS: There is no evidence of fracture or dislocation. There is no evidence of arthropathy or other focal bone abnormality. Soft tissue swelling in the region of the first metatarsal is identified.  IMPRESSION: Soft tissue swelling adjacent to the first metatarsal without underlying osseous abnormality.   Electronically Signed   By: Christiana Pellant M.D.   On: 10/05/2014 15:21    Assessment and Plan: 44 y.o. male with contusion crush injury to his right foot. Plan for postoperative shoe Norco naproxen. Follow up with occupational health clinic on Monday. Work note provided. Pressure elevated likely due to pain. Recheck in occupational health.  Discussed warning signs or symptoms. Please see discharge instructions. Patient expresses understanding.     Rodolph Bong, MD 10/05/14 [phone]

## 2014-10-05 NOTE — ED Notes (Signed)
Assessment per Dr. Corey. 

## 2014-11-07 ENCOUNTER — Other Ambulatory Visit: Payer: Self-pay | Admitting: Family Medicine

## 2014-11-07 DIAGNOSIS — M7989 Other specified soft tissue disorders: Secondary | ICD-10-CM

## 2014-11-08 ENCOUNTER — Ambulatory Visit
Admission: RE | Admit: 2014-11-08 | Discharge: 2014-11-08 | Disposition: A | Discharge status: Home / Self Care | Payer: BLUE CROSS/BLUE SHIELD | Source: Ambulatory Visit | Attending: Family Medicine | Admitting: Family Medicine

## 2014-11-08 ENCOUNTER — Other Ambulatory Visit: Payer: Self-pay | Admitting: Family Medicine

## 2014-11-08 DIAGNOSIS — T1490XA Injury, unspecified, initial encounter: Secondary | ICD-10-CM

## 2014-11-08 DIAGNOSIS — M7989 Other specified soft tissue disorders: Secondary | ICD-10-CM

## 2014-12-30 ENCOUNTER — Encounter (HOSPITAL_COMMUNITY): Payer: Self-pay | Admitting: *Deleted

## 2014-12-30 ENCOUNTER — Emergency Department (INDEPENDENT_AMBULATORY_CARE_PROVIDER_SITE_OTHER)
Admission: EM | Admit: 2014-12-30 | Discharge: 2014-12-30 | Disposition: A | Discharge status: Home / Self Care | Payer: BLUE CROSS/BLUE SHIELD | Source: Home / Self Care

## 2014-12-30 DIAGNOSIS — G43009 Migraine without aura, not intractable, without status migrainosus: Secondary | ICD-10-CM

## 2014-12-30 MED ORDER — KETOROLAC TROMETHAMINE 60 MG/2ML IM SOLN
INTRAMUSCULAR | Status: AC
  Filled 2014-12-30: qty 2

## 2014-12-30 MED ORDER — DIPHENHYDRAMINE HCL 50 MG/ML IJ SOLN
INTRAMUSCULAR | Status: AC
  Filled 2014-12-30: qty 1

## 2014-12-30 MED ORDER — ONDANSETRON 4 MG PO TBDP
4.0000 mg | ORAL_TABLET | Freq: Once | ORAL | Status: AC
  Administered 2014-12-30: 4 mg via ORAL

## 2014-12-30 MED ORDER — DIPHENHYDRAMINE HCL 50 MG/ML IJ SOLN
50.0000 mg | Freq: Once | INTRAMUSCULAR | Status: AC
  Administered 2014-12-30: 50 mg via INTRAMUSCULAR

## 2014-12-30 MED ORDER — KETOROLAC TROMETHAMINE 60 MG/2ML IM SOLN
60.0000 mg | Freq: Once | INTRAMUSCULAR | Status: AC
  Administered 2014-12-30: 60 mg via INTRAMUSCULAR

## 2014-12-30 MED ORDER — ONDANSETRON 4 MG PO TBDP
ORAL_TABLET | ORAL | Status: AC
  Filled 2014-12-30: qty 1

## 2014-12-30 NOTE — ED Provider Notes (Addendum)
CSN: [id number]     Arrival date & time 12/30/14  1641 History   None    Chief Complaint  Patient presents with  . Migraine   (Consider location/radiation/quality/duration/timing/severity/associated sxs/prior Treatment) Patient is a 44 y.o. male presenting with migraines. The history is provided by the patient.  Migraine This is a new problem. The current episode started 6 to 12 hours ago. The problem has been gradually worsening. Associated symptoms include headaches. Pertinent negatives include no chest pain and no abdominal pain. Associated symptoms comments: H/o similar problems, right sided, sl nausea , sl photophobia..    Past Medical History  Diagnosis Date  . Generalized headaches    History reviewed. No pertinent past surgical history. History reviewed. No pertinent family history. Social History  Substance Use Topics  . Smoking status: Never Smoker   . Smokeless tobacco: Never Used  . Alcohol Use: No    Review of Systems  Eyes: Positive for photophobia.  Cardiovascular: Negative for chest pain.  Gastrointestinal: Positive for nausea. Negative for abdominal pain.  Neurological: Positive for headaches.  All other systems reviewed and are negative.   Allergies  Review of patient's allergies indicates no known allergies.  Home Medications   Prior to Admission medications   Medication Sig Start Date End Date Taking? Authorizing Provider  HYDROcodone-acetaminophen (NORCO/VICODIN) 5-325 MG per tablet Take 1 tablet by mouth every 6 (six) hours as needed. 10/05/14   Rodolph BongEvan S Corey, MD  naproxen (NAPROSYN) 375 MG tablet Take 1 tablet (375 mg total) by mouth 2 (two) times daily. 10/05/14   Rodolph BongEvan S Corey, MD   Meds Ordered and Administered this Visit   Medications  ketorolac (TORADOL) injection 60 mg (not administered)  diphenhydrAMINE (BENADRYL) injection 50 mg (not administered)  ondansetron (ZOFRAN-ODT) disintegrating tablet 4 mg (not administered)    BP 142/76 mmHg   Pulse 78  Temp(Src) 98.6 F (37 C) (Oral)  Resp 16  SpO2 100% No data found.   Physical Exam  Constitutional: He is oriented to person, place, and time. He appears well-developed and well-nourished. No distress.  HENT:  Right Ear: External ear normal.  Left Ear: External ear normal.  Eyes: Conjunctivae and EOM are normal. Pupils are equal, round, and reactive to light.  Neck: Normal range of motion. Neck supple.  Cardiovascular: Normal rate.   Pulmonary/Chest: Breath sounds normal.  Lymphadenopathy:    He has no cervical adenopathy.  Neurological: He is alert and oriented to person, place, and time.  Skin: Skin is warm and dry.  Nursing note and vitals reviewed.   ED Course  Procedures (including critical care time)  Labs Review Labs Reviewed - No data to display  Imaging Review No results found.   Visual Acuity Review  Right Eye Distance:   Left Eye Distance:   Bilateral Distance:    Right Eye Near:   Left Eye Near:    Bilateral Near:         MDM   1. Migraine without aura and without status migrainosus, not intractable        Linna HoffJames D Deanette Tullius, MD 12/30/14 1901  Linna HoffJames D Kiandra Sanguinetti, MD 12/30/14 [phone]

## 2014-12-30 NOTE — ED Notes (Signed)
Pt  Reports  A  History  Of  Headaches        With  Symptoms  Starting  Today        -  Symptoms  Not  releived  By  advil              some  Photophobia  As  Well  As     Pain  With  Noise

## 2015-03-30 ENCOUNTER — Emergency Department (INDEPENDENT_AMBULATORY_CARE_PROVIDER_SITE_OTHER): Payer: BLUE CROSS/BLUE SHIELD

## 2015-03-30 ENCOUNTER — Emergency Department (INDEPENDENT_AMBULATORY_CARE_PROVIDER_SITE_OTHER)
Admission: EM | Admit: 2015-03-30 | Discharge: 2015-03-30 | Disposition: A | Discharge status: Home / Self Care | Payer: Self-pay | Source: Home / Self Care | Attending: Family Medicine | Admitting: Family Medicine

## 2015-03-30 ENCOUNTER — Encounter (HOSPITAL_COMMUNITY): Payer: Self-pay | Admitting: Emergency Medicine

## 2015-03-30 DIAGNOSIS — M79672 Pain in left foot: Secondary | ICD-10-CM | POA: Diagnosis not present

## 2015-03-30 DIAGNOSIS — M7732 Calcaneal spur, left foot: Secondary | ICD-10-CM

## 2015-03-30 MED ORDER — NAPROXEN SODIUM 550 MG PO TABS: 550.0000 mg | ORAL_TABLET | Freq: Two times a day (BID) | ORAL | Status: DC

## 2015-03-30 NOTE — Discharge Instructions (Signed)
Heel Spur  A heel spur is a bony growth that forms on the bottom of your heel bone (calcaneus). Heel spurs are common and do not always cause pain. However, heel spurs often cause inflammation in the strong band of tissue that runs underneath the bone of your foot (plantar fascia). When this happens, you may feel pain on the bottom of your foot, near your heel.   CAUSES   The cause of heel spurs is not completely understood. They may be caused by pressure on the heel. Or, they may stem from the muscle attachments (tendons) near the spur pulling on the heel.   RISK FACTORS  You may be at risk for a heel spur if you:  · Are older than 40.  · Are overweight.  · Have wear and tear arthritis (osteoarthritis).  · Have plantar fascia inflammation.  SIGNS AND SYMPTOMS   Some people have heel spurs but no symptoms. If you do have symptoms, they may include:   · Pain in the bottom of your heel.  · Pain that is worse when you first get out of bed.  · Pain that gets worse after walking or standing.  DIAGNOSIS   Your health care provider may diagnose a heel spur based on your symptoms and a physical exam. You may also have an X-ray of your foot to check for a bony growth coming from the calcaneus.   TREATMENT  Treatment aims to relieve the pain from the heel spur. This may include:  · Stretching exercises.  · Losing weight.  · Wearing specific shoes, inserts, or orthotics for comfort and support.  · Wearing splints at night to properly position your feet.  · Taking over-the-counter medicine to relieve pain.  · Being treated with high-intensity sound waves to break up the heel spur (extracorporeal shock wave therapy).  · Getting steroid injections in your heel to reduce swelling and ease pain.  · Having surgery if your heel spur causes long-term (chronic) pain.  HOME CARE INSTRUCTIONS   · Take medicines only as directed by your health care provider.  · Ask your health care provider if you should use ice or cold packs on the  painful areas of your heel or foot.  · Avoid activities that cause you pain until you recover or as directed by your health care provider.  · Stretch before exercising or being physically active.  · Wear supportive shoes that fit well as directed by your health care provider. You might need to buy new shoes. Wearing old shoes or shoes that do not fit correctly may not provide the support that you need.  · Lose weight if your health care provider thinks you should. This can relieve pressure on your foot that may be causing pain and discomfort.  SEEK MEDICAL CARE IF:   · Your pain continues or gets worse.     This information is not intended to replace advice given to you by your health care provider. Make sure you discuss any questions you have with your health care provider.     Document Released: 02/17/2005 Document Revised: 02/01/2014 Document Reviewed: 03/14/2013  Elsevier Interactive Patient Education ©2016 [employer]

## 2015-03-30 NOTE — ED Notes (Signed)
[facility] a [facility] of left heel and foot pain x 5 days. The patient denied any known injury.

## 2015-03-30 NOTE — ED Provider Notes (Signed)
CSN: [id number]     Arrival date & time 03/30/15  1519 History   First MD Initiated Contact with Patient 03/30/15 1728     Chief Complaint  Patient presents with  . Foot Pain   (Consider location/radiation/quality/duration/timing/severity/associated sxs/prior Treatment) HPI Danny Kim presents with left heel pain that began Wednesday.  It radiates up his posterior lower leg to the posterior knee. There is mild swelling at his heel and he is limping. He denies trauma history, numbness, tingling, weakness, fever.  He does not currently drink alcohol or smoke.  He works in [employer] and got new steel-toe boots about two months ago. Past Medical History  Diagnosis Date  . Generalized headaches    History reviewed. No pertinent past surgical history. History reviewed. No pertinent family history. Social History  Substance Use Topics  . Smoking status: Never Smoker   . Smokeless tobacco: Never Used  . Alcohol Use: No    Review of Systems See HPI, otherwise negative.    Allergies  Review of patient's allergies indicates no known allergies.  Home Medications   Prior to Admission medications   Medication Sig Start Date End Date Taking? Authorizing Provider  naproxen (NAPROSYN) 375 MG tablet Take 1 tablet (375 mg total) by mouth 2 (two) times daily. 10/05/14  Yes Rodolph BongEvan S Corey, MD  HYDROcodone-acetaminophen (NORCO/VICODIN) 5-325 MG per tablet Take 1 tablet by mouth every 6 (six) hours as needed. 10/05/14   Rodolph BongEvan S Corey, MD   Meds Ordered and Administered this Visit  Medications - No data to display  BP 135/79 mmHg  Pulse 76  Temp(Src) 97.1 F (36.2 C)  Resp 14  SpO2 100% No data found.   Physical Exam Lower extremities: No pain with internal/external rotation of hip. Full range of motion and 5/5 strength in knees and ankles bilaterally. Pedal pulses and light sensation intact bilaterally. 1 cm swelling palpable at left posterior heel near insertion of Achilles tendon. Skin is intact.  No tenderness with calf squeeze, squeezing Achilles tendon, nor horizontal/vertical compression of foot. Negative Thompson's test.  ED Course  Procedures (including critical care time)  Labs Review Labs Reviewed - No data to display  Imaging Review No results found.   Visual Acuity Review  Right Eye Distance:   Left Eye Distance:   Bilateral Distance:    Right Eye Near:   Left Eye Near:    Bilateral Near:        RX for anaprox is given. MDM   1. Left foot pain   2. Heel spur, left     Xray demonstrates small spur at posterior border of os calcis. Will refer to podiatry and recommend OTC Ibuprofen and wrapping the heel in a bandage when he wears boots.   Tharon AquasFrank C Patrick, PA 03/30/15 [phone]

## 2015-09-20 IMAGING — DX DG FOOT COMPLETE 3+V*R*
3 series · 3 of 3 positions shown · non-contrast
Comparison: None.

CLINICAL DATA: Crush injury, pain at the first metatarsophalangeal
joint

EXAM:
RIGHT FOOT COMPLETE - 3+ VIEW

[foot ap]
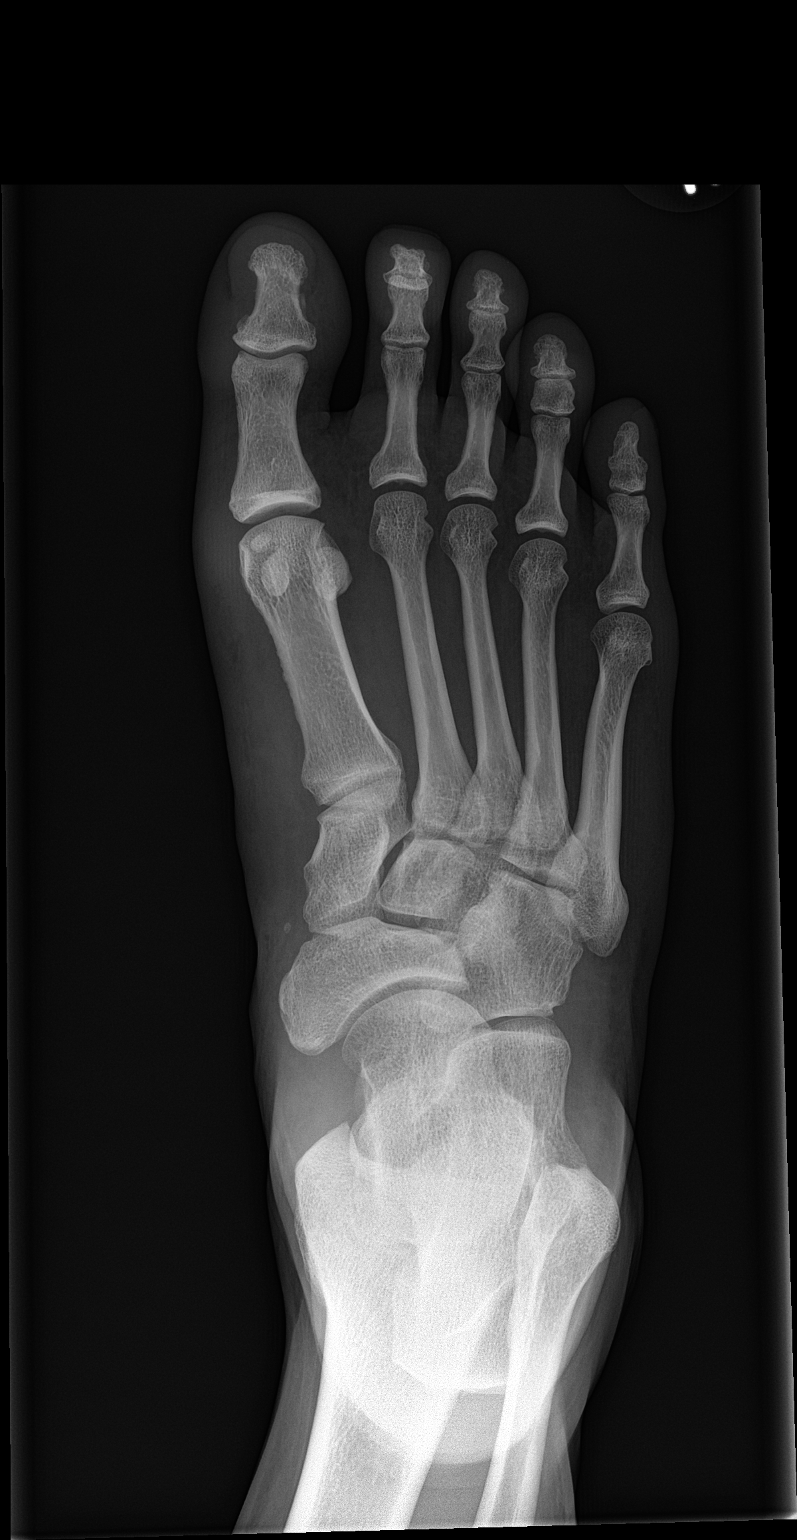

[foot obl]
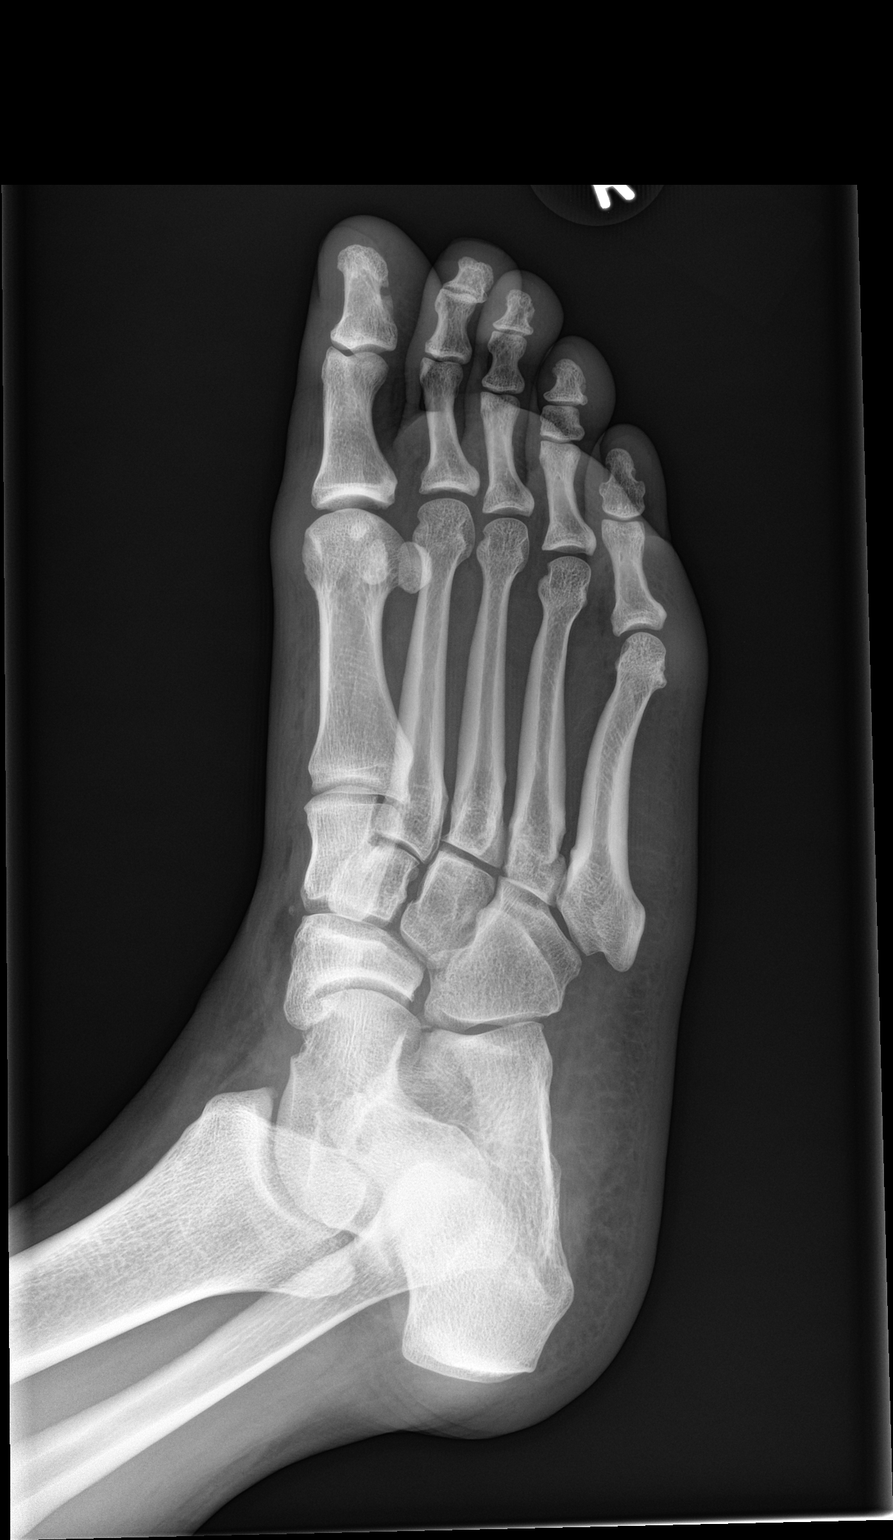

[foot lat]
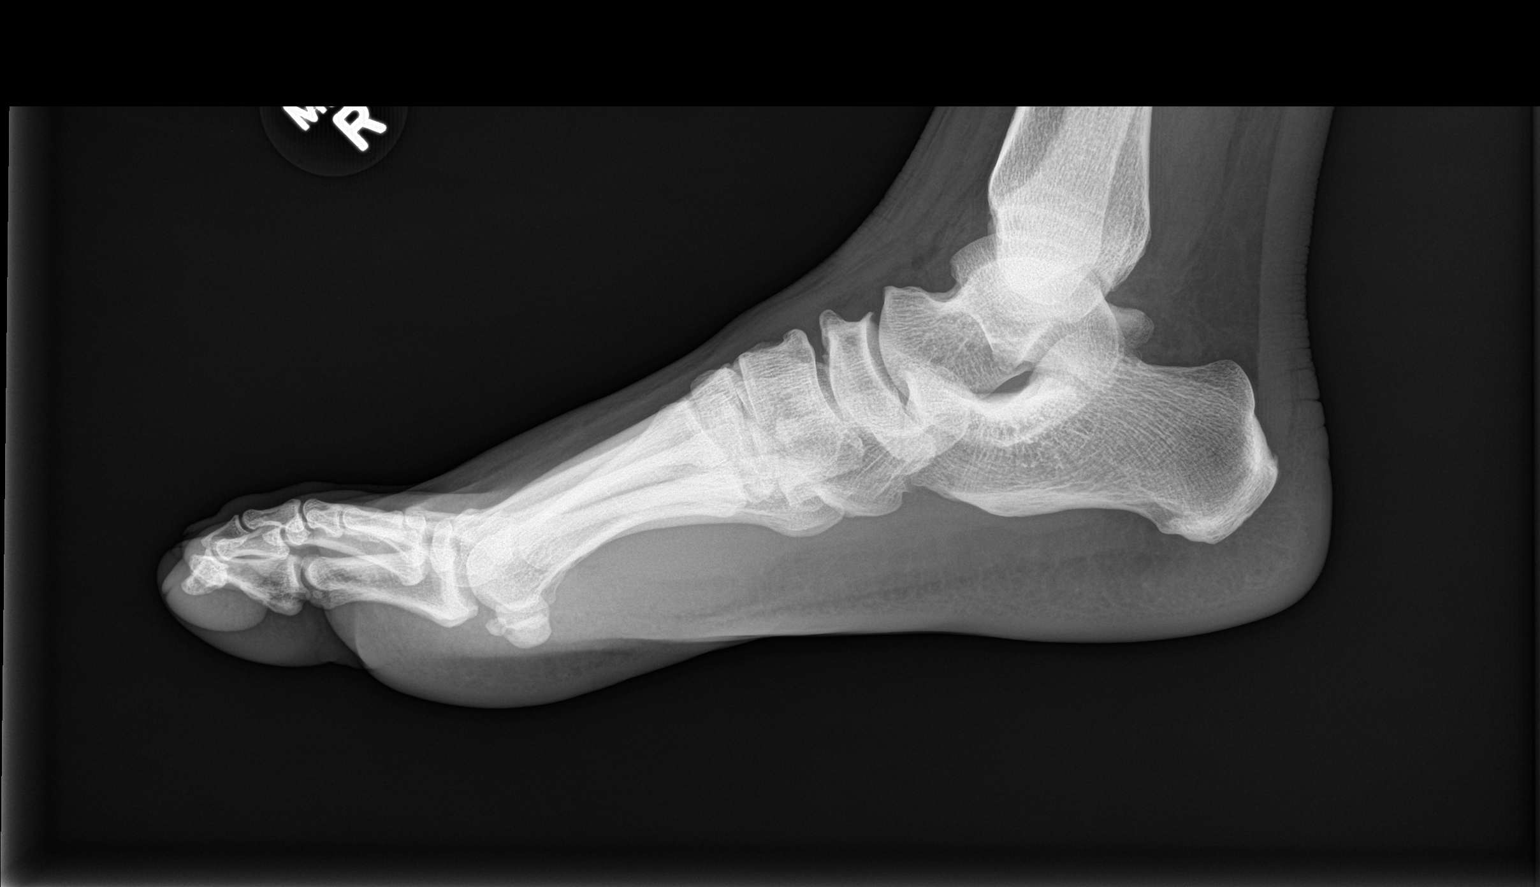

[3 of 3 positions shown; findings below may reference images not displayed]

FINDINGS: There is no evidence of fracture or dislocation. There is no
evidence of arthropathy or other focal bone abnormality. Soft tissue
swelling in the region of the first metatarsal is identified.
IMPRESSION: Soft tissue swelling adjacent to the first metatarsal without
underlying osseous abnormality.

## 2016-04-14 ENCOUNTER — Encounter (HOSPITAL_COMMUNITY): Payer: Self-pay | Admitting: Emergency Medicine

## 2016-04-14 DIAGNOSIS — K4090 Unilateral inguinal hernia, without obstruction or gangrene, not specified as recurrent: Secondary | ICD-10-CM | POA: Insufficient documentation

## 2016-04-14 LAB — COMPREHENSIVE METABOLIC PANEL
ALT: 32 U/L (ref 17–63)
ANION GAP: 9 (ref 5–15)
AST: 30 U/L (ref 15–41)
Albumin: 4.2 g/dL (ref 3.5–5.0)
Alkaline Phosphatase: 96 U/L (ref 38–126)
BILIRUBIN TOTAL: 0.8 mg/dL (ref 0.3–1.2)
BUN: 9 mg/dL (ref 6–20)
CO2: 25 mmol/L (ref 22–32)
Calcium: 9 mg/dL (ref 8.9–10.3)
Chloride: 102 mmol/L (ref 101–111)
Creatinine, Ser: 0.91 mg/dL (ref 0.61–1.24)
Glucose, Bld: 104 mg/dL — ABNORMAL HIGH (ref 65–99)
Potassium: 3.8 mmol/L (ref 3.5–5.1)
SODIUM: 136 mmol/L (ref 135–145)
TOTAL PROTEIN: 7.6 g/dL (ref 6.5–8.1)

## 2016-04-14 LAB — URINALYSIS, ROUTINE W REFLEX MICROSCOPIC
BILIRUBIN URINE: NEGATIVE
Glucose, UA: NEGATIVE mg/dL
Hgb urine dipstick: NEGATIVE
Ketones, ur: NEGATIVE mg/dL
Leukocytes, UA: NEGATIVE
NITRITE: NEGATIVE
PROTEIN: NEGATIVE mg/dL
SPECIFIC GRAVITY, URINE: 1.012 (ref 1.005–1.030)
pH: 5 (ref 5.0–8.0)

## 2016-04-14 LAB — LIPASE, BLOOD: Lipase: 16 U/L (ref 11–51)

## 2016-04-14 LAB — CBC
HCT: 43.2 % (ref 39.0–52.0)
HEMOGLOBIN: 13.7 g/dL (ref 13.0–17.0)
MCH: 22.1 pg — ABNORMAL LOW (ref 26.0–34.0)
MCHC: 31.7 g/dL (ref 30.0–36.0)
MCV: 69.8 fL — ABNORMAL LOW (ref 78.0–100.0)
PLATELETS: 230 10*3/uL (ref 150–400)
RBC: 6.19 MIL/uL — AB (ref 4.22–5.81)
RDW: 14.7 % (ref 11.5–15.5)
WBC: 8.5 10*3/uL (ref 4.0–10.5)

## 2016-04-14 NOTE — ED Triage Notes (Signed)
Patient reports intermittent Mid/RLQ abdominal pain with chills onset last month , denies fever , emesis or diarrhea .

## 2016-04-15 ENCOUNTER — Emergency Department (HOSPITAL_COMMUNITY)
Admission: EM | Admit: 2016-04-15 | Discharge: 2016-04-15 | Disposition: A | Discharge status: Home / Self Care | Payer: Self-pay | Attending: Emergency Medicine | Admitting: Emergency Medicine

## 2016-04-15 DIAGNOSIS — K4090 Unilateral inguinal hernia, without obstruction or gangrene, not specified as recurrent: Secondary | ICD-10-CM

## 2016-04-15 DIAGNOSIS — R1030 Lower abdominal pain, unspecified: Secondary | ICD-10-CM

## 2016-04-15 NOTE — ED Provider Notes (Signed)
[facility]-EMERGENCY DEPT Provider Note   CSN: [id number] Arrival date & time: 04/14/16  2215     History   Chief Complaint Chief Complaint  Patient presents with  . Abdominal Pain    Chills    HPI Danny Kim is a 46 y.o. male.  Patient without other medical history presents with complaint of suprapubic abdominal pain x 3 days and is associated with a swollen area to the right lower abdomen. No fever, nausea or vomiting. No changes in bowel movements, and has no constipation or diarrhea. No urinary symptoms, testicular pain or scrotal swelling. He reports having chills when he has the pain but also reports chills that have been occurring for longer than he has had this pain.   The history is provided by the patient and the spouse. A language interpreter was used (Patient speaks broken english, wife (who is fluent) is assisting).  Abdominal Pain   Pertinent negatives include fever, diarrhea, nausea, vomiting and constipation.    Past Medical History:  Diagnosis Date  . Generalized headaches     There are no active problems to display for this patient.   History reviewed. No pertinent surgical history.     Home Medications    Prior to Admission medications   Medication Sig Start Date End Date Taking? Authorizing Provider  HYDROcodone-acetaminophen (NORCO/VICODIN) 5-325 MG per tablet Take 1 tablet by mouth every 6 (six) hours as needed. 10/05/14   Rodolph BongEvan S Corey, MD  naproxen (NAPROSYN) 375 MG tablet Take 1 tablet (375 mg total) by mouth 2 (two) times daily. 10/05/14   Rodolph BongEvan S Corey, MD  naproxen sodium (ANAPROX DS) 550 MG tablet Take 1 tablet (550 mg total) by mouth 2 (two) times daily with a meal. 03/30/15   Tharon AquasFrank C Patrick, PA    Family History No family history on file.  Social History Social History  Substance Use Topics  . Smoking status: Never Smoker  . Smokeless tobacco: Never Used  . Alcohol use No     Allergies   Patient has no known allergies.   Review of  Systems Review of Systems  Constitutional: Negative for fever.  Respiratory: Negative for shortness of breath.   Cardiovascular: Negative for chest pain.  Gastrointestinal: Positive for abdominal pain. Negative for blood in stool, constipation, diarrhea, nausea and vomiting.  Genitourinary: Negative for difficulty urinating and scrotal swelling.  Skin: Negative for color change.     Physical Exam Updated Vital Signs BP 132/84   Pulse 94   Temp 97.7 F (36.5 C)   Resp 15   Ht 5\' 5"  (1.651 m)   Wt 77.1 kg   SpO2 96%   BMI 28.29 kg/m   Physical Exam  Constitutional: He is oriented to person, place, and time. He appears well-developed and well-nourished.  Neck: Normal range of motion.  Pulmonary/Chest: Effort normal.  Abdominal: There is no tenderness.  Genitourinary: Testes normal and penis normal. Right testis shows no tenderness. Left testis shows no tenderness. Circumcised.  Genitourinary Comments: There is a soft, mildly tender mass in the right inguinal region consistent with easily reducible hernia. No scrotal involvement.   Musculoskeletal: Normal range of motion.  Neurological: He is alert and oriented to person, place, and time.  Skin: Skin is warm and dry.  Psychiatric: He has a normal mood and affect.     ED Treatments / Results  Labs (all labs ordered are listed, but only abnormal results are displayed) Labs Reviewed  COMPREHENSIVE METABOLIC PANEL - Abnormal;  Notable for the following:       Result Value   Glucose, Bld 104 (*)    All other components within normal limits  CBC - Abnormal; Notable for the following:    RBC 6.19 (*)    MCV 69.8 (*)    MCH 22.1 (*)    All other components within normal limits  LIPASE, BLOOD  URINALYSIS, ROUTINE W REFLEX MICROSCOPIC    EKG  EKG Interpretation None       Radiology No results found.  Procedures Procedures (including critical care time)  Medications Ordered in ED Medications - No data to  display   Initial Impression / Assessment and Plan / ED Course  I have reviewed the triage vital signs and the nursing notes.  Pertinent labs & imaging results that were available during my care of the patient were reviewed by me and considered in my medical decision making (see chart for details).     Patient presents with right inguinal hernia on exam. Discussed CT scan given tenderness with patient and wife, including risks and benefits, description of incarcerated hernia, reducible hernia and elective surgical follow up for what is believed to be a simple hernia. The pt and wife decide not to obtain CT tonight. Surgical referral is provided. Strict return precautions discussed.   Final Clinical Impressions(s) / ED Diagnoses   Final diagnoses:  Lower abdominal pain  Non-recurrent unilateral inguinal hernia without obstruction or gangrene    New Prescriptions New Prescriptions   No medications on file     Elpidio Anis, PA-C 04/21/16 0041    Shon Baton, MD 04/24/16 2252

## 2016-10-26 ENCOUNTER — Emergency Department (HOSPITAL_COMMUNITY)
Admission: EM | Admit: 2016-10-26 | Discharge: 2016-10-27 | Disposition: A | Discharge status: Home / Self Care | Payer: Self-pay | Attending: Emergency Medicine | Admitting: Emergency Medicine

## 2016-10-26 ENCOUNTER — Emergency Department (HOSPITAL_COMMUNITY): Payer: Self-pay

## 2016-10-26 ENCOUNTER — Encounter (HOSPITAL_COMMUNITY): Payer: Self-pay | Admitting: Emergency Medicine

## 2016-10-26 DIAGNOSIS — Z79899 Other long term (current) drug therapy: Secondary | ICD-10-CM | POA: Insufficient documentation

## 2016-10-26 DIAGNOSIS — R1030 Lower abdominal pain, unspecified: Secondary | ICD-10-CM

## 2016-10-26 DIAGNOSIS — K921 Melena: Secondary | ICD-10-CM

## 2016-10-26 DIAGNOSIS — R1031 Right lower quadrant pain: Secondary | ICD-10-CM | POA: Insufficient documentation

## 2016-10-26 LAB — COMPREHENSIVE METABOLIC PANEL
ALK PHOS: 102 U/L (ref 38–126)
ALT: 34 U/L (ref 17–63)
ANION GAP: 10 (ref 5–15)
AST: 29 U/L (ref 15–41)
Albumin: 4.4 g/dL (ref 3.5–5.0)
BILIRUBIN TOTAL: 0.9 mg/dL (ref 0.3–1.2)
BUN: 20 mg/dL (ref 6–20)
CALCIUM: 9.3 mg/dL (ref 8.9–10.3)
CO2: 22 mmol/L (ref 22–32)
Chloride: 107 mmol/L (ref 101–111)
Creatinine, Ser: 1.08 mg/dL (ref 0.61–1.24)
GFR calc non Af Amer: >60 mL/min (ref >60)
Glucose, Bld: 72 mg/dL (ref 65–99)
POTASSIUM: 4 mmol/L (ref 3.5–5.1)
Sodium: 139 mmol/L (ref 135–145)
TOTAL PROTEIN: 7.7 g/dL (ref 6.5–8.1)

## 2016-10-26 LAB — URINALYSIS, ROUTINE W REFLEX MICROSCOPIC
Bacteria, UA: NONE SEEN
Bilirubin Urine: NEGATIVE
GLUCOSE, UA: NEGATIVE mg/dL
Hgb urine dipstick: NEGATIVE
Ketones, ur: NEGATIVE mg/dL
Leukocytes, UA: NEGATIVE
Nitrite: NEGATIVE
PH: 5 (ref 5.0–8.0)
PROTEIN: 30 mg/dL — AB
SPECIFIC GRAVITY, URINE: 1.027 (ref 1.005–1.030)

## 2016-10-26 LAB — TYPE AND SCREEN
ABO/RH(D): O POS
ANTIBODY SCREEN: NEGATIVE

## 2016-10-26 LAB — CBC
HCT: 43.3 % (ref 39.0–52.0)
HEMOGLOBIN: 14.1 g/dL (ref 13.0–17.0)
MCH: 22.7 pg — ABNORMAL LOW (ref 26.0–34.0)
MCHC: 32.6 g/dL (ref 30.0–36.0)
MCV: 69.8 fL — ABNORMAL LOW (ref 78.0–100.0)
Platelets: 284 10*3/uL (ref 150–400)
RBC: 6.2 MIL/uL — AB (ref 4.22–5.81)
RDW: 14.4 % (ref 11.5–15.5)
WBC: 11.7 10*3/uL — ABNORMAL HIGH (ref 4.0–10.5)

## 2016-10-26 LAB — ABO/RH: ABO/RH(D): O POS

## 2016-10-26 LAB — POC OCCULT BLOOD, ED: FECAL OCCULT BLD: POSITIVE — AB

## 2016-10-26 LAB — LIPASE, BLOOD: Lipase: 32 U/L (ref 11–51)

## 2016-10-26 MED ORDER — ONDANSETRON HCL 4 MG/2ML IJ SOLN
4.0000 mg | Freq: Once | INTRAMUSCULAR | Status: AC
  Administered 2016-10-26: 4 mg via INTRAVENOUS
  Filled 2016-10-26: qty 2

## 2016-10-26 MED ORDER — IOPAMIDOL (ISOVUE-300) INJECTION 61%
100.0000 mL | Freq: Once | INTRAVENOUS | Status: AC | PRN
  Administered 2016-10-26: 100 mL via INTRAVENOUS

## 2016-10-26 MED ORDER — HYDROMORPHONE HCL 1 MG/ML IJ SOLN
0.5000 mg | Freq: Once | INTRAMUSCULAR | Status: AC
  Administered 2016-10-26: 0.5 mg via INTRAVENOUS
  Filled 2016-10-26: qty 1

## 2016-10-26 MED ORDER — SODIUM CHLORIDE 0.9 % IV BOLUS (SEPSIS)
500.0000 mL | Freq: Once | INTRAVENOUS | Status: AC
  Administered 2016-10-26: 500 mL via INTRAVENOUS

## 2016-10-26 MED ORDER — FENTANYL CITRATE (PF) 100 MCG/2ML IJ SOLN
50.0000 ug | INTRAMUSCULAR | Status: DC | PRN
Start: 2016-10-26 — End: 2016-10-27
  Administered 2016-10-26: 50 ug via NASAL
  Filled 2016-10-26: qty 2

## 2016-10-26 MED ORDER — FENTANYL CITRATE (PF) 100 MCG/2ML IJ SOLN
INTRAMUSCULAR | Status: AC
  Filled 2016-10-26: qty 2

## 2016-10-26 NOTE — ED Notes (Signed)
100 mcg Fentanyl grabbed for the pt and not administered due to no recent order for Fentany, whole vial returned to pharmacy given to [facility] since I waste 50 mcg on pixys prior to noticed MD orders.

## 2016-10-26 NOTE — ED Triage Notes (Signed)
Pt with sudden onset of lower abdominal pain, nausea, dysuria, noted dark red loose stools this morning. Pt guarding abdomen appears in distress. VSS.

## 2016-10-26 NOTE — ED Notes (Addendum)
Called for triage.  Pt in bathroom. 

## 2016-10-26 NOTE — ED Notes (Signed)
Patient transported to CT 

## 2016-10-27 MED ORDER — OMEPRAZOLE 20 MG PO CPDR: 20.0000 mg | DELAYED_RELEASE_CAPSULE | Freq: Every day | ORAL | 0 refills | Status: DC

## 2016-10-27 MED ORDER — DICYCLOMINE HCL 20 MG PO TABS: 20.0000 mg | ORAL_TABLET | Freq: Two times a day (BID) | ORAL | 0 refills | Status: DC

## 2016-10-27 NOTE — Discharge Instructions (Signed)
Please read and follow all provided instructions.  Your diagnoses today include:  1. Lower abdominal pain   2. Hematochezia     Tests performed today include:  Blood counts and electrolytes  Blood tests to check liver and kidney function  Blood tests to check pancreas function  Stool test for blood - shows blood in the stool  Vital signs. See below for your results today.   Medications prescribed:   Bentyl - medication for intestinal cramps and spasms   Omeprazole (Prilosec) - stomach acid reducer  This medication can be found over-the-counter  Take any prescribed medications only as directed.  Home care instructions:   Follow any educational materials contained in this packet.  Follow-up instructions: Please follow-up with your primary care provider in the next 2 days for further evaluation of your symptoms.    Return instructions:  SEEK IMMEDIATE MEDICAL ATTENTION IF:  The pain does not go away or becomes severe   A temperature above 101F develops   Repeated vomiting occurs (multiple episodes)   The pain becomes localized to portions of the abdomen. The right side could possibly be appendicitis. In an adult, the left lower portion of the abdomen could be colitis or diverticulitis.   Excessive blood is being passed in stools or vomit (bright red or black tarry stools)   You develop chest pain, difficulty breathing, dizziness or fainting, or become confused, poorly responsive, or inconsolable (young children)  If you have any other emergent concerns regarding your health  Additional Information: Abdominal (belly) pain can be caused by many things. Your caregiver performed an examination and possibly ordered blood/urine tests and imaging (CT scan, x-rays, ultrasound). Many cases can be observed and treated at home after initial evaluation in the emergency department. Even though you are being discharged home, abdominal pain can be unpredictable. Therefore, you  need a repeated exam if your pain does not resolve, returns, or worsens. Most patients with abdominal pain don't have to be admitted to the hospital or have surgery, but serious problems like appendicitis and gallbladder attacks can start out as nonspecific pain. Many abdominal conditions cannot be diagnosed in one visit, so follow-up evaluations are very important.  Your vital signs today were: BP (!) 145/100 (BP Location: Left Arm)    Pulse 78    Temp 97.9 F (36.6 C) (Oral)    Resp 18    Ht  (1.626 m)    Wt 65.8 kg (145 lb)    SpO2 99%    BMI 24.89 kg/m  If your blood pressure (bp) was elevated above 135/85 this visit, please have this repeated by your doctor within one month. --------------

## 2016-10-27 NOTE — ED Provider Notes (Signed)
[facility]-EMERGENCY DEPT Provider Note   CSN: [id number] Arrival date & time: 10/26/16  1521     History   Chief Complaint Chief Complaint  Patient presents with  . Abdominal Pain  . Rectal Bleeding  . Dysuria    HPI Danny Kim is a 46 y.o. male.  Patient with acute onset of abdominal pain this morning associated with dark red stools. Pain has been waxing and waning, described as cramping. It is mid lower abdominal pain and right lower quadrant abdominal pain without radiation. No associated fevers, vomiting or diarrhea. Patient has had dysuria but no hematuria, increased frequency, or urgency. No history of abdominal surgeries. No alcohol use or NSAID use. No recent antibiotics. No lightheadedness, chest pain, shortness of breath, syncope, palpitations. No treatments prior to arrival.      Past Medical History:  Diagnosis Date  . Generalized headaches     There are no active problems to display for this patient.   History reviewed. No pertinent surgical history.     Home Medications    Prior to Admission medications   Medication Sig Start Date End Date Taking? Authorizing Provider  acetaminophen (TYLENOL) 500 MG tablet Take 500-1,000 mg by mouth every 6 (six) hours as needed (for pain or headaches).   Yes [provider]  dicyclomine (BENTYL) 20 MG tablet Take 1 tablet (20 mg total) by mouth 2 (two) times daily. 10/27/16   Renne Crigler, PA-C  omeprazole (PRILOSEC) 20 MG capsule Take 1 capsule (20 mg total) by mouth daily. 10/27/16   Renne Crigler, PA-C    Family History History reviewed. No pertinent family history.  Social History Social History  Substance Use Topics  . Smoking status: Never Smoker  . Smokeless tobacco: Never Used  . Alcohol use No     Allergies   Bee venom   Review of Systems Review of Systems  Constitutional: Negative for fever.  HENT: Negative for rhinorrhea and sore throat.   Eyes: Negative for redness.  Respiratory:  Negative for cough.   Cardiovascular: Negative for chest pain.  Gastrointestinal: Positive for abdominal pain, blood in stool and nausea. Negative for constipation, diarrhea and vomiting.  Genitourinary: Positive for dysuria. Negative for frequency, hematuria and urgency.  Musculoskeletal: Negative for myalgias.  Skin: Negative for rash.  Neurological: Negative for headaches.  Hematological: Does not bruise/bleed easily.     Physical Exam Updated Vital Signs BP (!) 145/85   Pulse 70   Temp 98.2 F (36.8 C)   Resp 18   Ht  (1.626 m)   Wt 65.8 kg (145 lb)   SpO2 99%   BMI 24.89 kg/m   Physical Exam  Constitutional: He appears well-developed and well-nourished.  HENT:  Head: Normocephalic and atraumatic.  Mouth/Throat: Oropharynx is clear and moist.  No lesions.  Eyes: Conjunctivae are normal. Right eye exhibits no discharge. Left eye exhibits no discharge.  Neck: Normal range of motion. Neck supple.  Cardiovascular: Normal rate, regular rhythm and normal heart sounds.   No murmur heard. Pulmonary/Chest: Effort normal and breath sounds normal.  Abdominal: Soft. Bowel sounds are normal. There is tenderness (mild to moderate abdominal tenderness in the right lower quadrant to suprapubic area.) in the right lower quadrant and suprapubic area. There is no rebound, no guarding, no tenderness at McBurney's point and negative Murphy's sign.  Genitourinary: Rectal exam shows guaiac positive stool. Rectal exam shows no external hemorrhoid, no internal hemorrhoid, no fissure, no mass and no tenderness.  Genitourinary Comments: Manson Passey  stool.  Neurological: He is alert.  Skin: Skin is warm and dry.  No significant ecchymosis.  Psychiatric: He has a normal mood and affect.  Nursing note and vitals reviewed.    ED Treatments / Results  Labs (all labs ordered are listed, but only abnormal results are displayed) Labs Reviewed  CBC - Abnormal; Notable for the following:        Result Value   WBC 11.7 (*)    RBC 6.20 (*)    MCV 69.8 (*)    MCH 22.7 (*)    All other components within normal limits  URINALYSIS, ROUTINE W REFLEX MICROSCOPIC - Abnormal; Notable for the following:    Protein, ur 30 (*)    Squamous Epithelial / LPF 0-5 (*)    All other components within normal limits  POC OCCULT BLOOD, ED - Abnormal; Notable for the following:    Fecal Occult Bld POSITIVE (*)    All other components within normal limits  COMPREHENSIVE METABOLIC PANEL  LIPASE, BLOOD  TYPE AND SCREEN  ABO/RH     Radiology Ct Abdomen Pelvis W Contrast  Result Date: 10/26/2016 CLINICAL DATA:  Abdominal pain, dysuria, nausea and dark stools beginning this morning. EXAM: CT ABDOMEN AND PELVIS WITH CONTRAST TECHNIQUE: Multidetector CT imaging of the abdomen and pelvis was performed using the standard protocol following bolus administration of intravenous contrast. CONTRAST:  ISOVUE-300 IOPAMIDOL (ISOVUE-300) INJECTION 61% COMPARISON:  CT abdomen and pelvis October 28, 2009 FINDINGS: LOWER CHEST: Dependent atelectasis. Included heart size is normal. No pericardial effusion. HEPATOBILIARY: Liver and gallbladder are normal. PANCREAS: Normal. SPLEEN: Normal.  Subcentimeter splenic capsular calcification. ADRENALS/URINARY TRACT: Kidneys are orthotopic, demonstrating symmetric enhancement. No nephrolithiasis, hydronephrosis or solid renal masses. The unopacified ureters are normal in course and caliber. Delayed imaging through the kidneys demonstrates symmetric prompt contrast excretion within the proximal urinary collecting system. Urinary bladder is well distended and unremarkable. Normal adrenal glands. STOMACH/BOWEL: Very small hiatal hernia. The stomach, small and large bowel are normal in course and caliber without inflammatory changes. Small air-filled duodenum diverticulum. Normal appendix. VASCULAR/LYMPHATIC: Aortoiliac vessels are normal in course and caliber. No lymphadenopathy by CT  size criteria. REPRODUCTIVE: Normal. OTHER: No intraperitoneal free fluid or free air. MUSCULOSKELETAL: Nonacute. Mild broad levoscoliosis may be positional. 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 Mild degenerative change of the thoracic spine. Tiny fat containing supraumbilical hernia. IMPRESSION: 1. No acute intra-abdominal or pelvic process.  Normal appendix. Electronically Signed   By: Awilda Metro M.D.   On: 10/26/2016 23:53    Procedures Procedures (including critical care time)  Medications Ordered in ED Medications  fentaNYL (SUBLIMAZE) injection 50 mcg (50 mcg Nasal Given 10/26/16 1543)  fentaNYL (SUBLIMAZE) 100 MCG/2ML injection ( Nasal Canceled Entry 10/26/16 2036)  HYDROmorphone (DILAUDID) injection 0.5 mg (0.5 mg Intravenous Given 10/26/16 2156)  ondansetron (ZOFRAN) injection 4 mg (4 mg Intravenous Given 10/26/16 2156)  sodium chloride 0.9 % bolus 500 mL (0 mLs Intravenous Stopped 10/27/16 0008)  iopamidol (ISOVUE-300) 61 % injection 100 mL (100 mLs Intravenous Contrast Given 10/26/16 2324)     Initial Impression / Assessment and Plan / ED Course  I have reviewed the triage vital signs and the nursing notes.  Pertinent labs & imaging results that were available during my care of the patient were reviewed by me and considered in my medical decision making (see chart for details).     Patient seen and examined. Work-up reviewed  with patient and family at bedside. Medications for pain ordered. Will proceed with CT imaging given suprapubic and right lower quadrant pain to rule out appendicitis or other intra-abdominal etiology of symptoms.  Vital signs reviewed and are as follows: BP (!) 145/85   Pulse 70   Temp 98.2 F (36.8 C)   Resp 18   Ht  (1.626 m)   Wt 65.8 kg (145 lb)   SpO2 99%   BMI 24.89 kg/m   Rectal exam performed with NT chaperone. Stool was not grossly bloody. Stool is  brown. Heme positive.  12:49 AM patient feels better with treatment. Updated family and patient at bedside. Spoke with patient's wife via video chat.   Will discharge home with omeprazole and Bentyl. Patient has a family provider who he will call tomorrow and schedule a recheck in the next 48 hours.  Counseled on brat diet, avoidance of NSAIDs, alcohol.  The patient was urged to return to the Emergency Department immediately with worsening of current symptoms, worsening abdominal pain, persistent vomiting, worsening amt blood noted in stools, fever, or any other concerns. The patient verbalized understanding.    Final Clinical Impressions(s) / ED Diagnoses   Final diagnoses:  Lower abdominal pain  Hematochezia   Lower abdominal pain: Patient with abdominal pain. Vitals are stable, no fever. Labs with mild leukocytosis, labs otherwise reassuring. Imaging does not demonstrate acute intra-abdominal pathology. No signs of dehydration, patient is tolerating PO's. Lungs are clear and no signs suggestive of PNA. Low concern for appendicitis, cholecystitis, pancreatitis, ruptured viscus, UTI, kidney stone, aortic dissection, aortic aneurysm or other emergent abdominal etiology. Supportive therapy indicated with return if symptoms worsen.   GI bleeding: GI bleeding suspected given passage of heme positive stool, hematochezia (maroon stool). No anemia. Stool on rectal exam is brown, not grossly bloody.  None of the following red flags identified or suspected: *Abnormal vital signs *Symptoms suggestive of malignancy such as constitutional symptoms (fever, weight loss), anemia, or change in frequency, caliber or consistency of stools * Family history of colon cancer  The patient appears reasonably screened and/or stabilized for discharge and I doubt any other medical condition or other emergency medical conditions requiring further screening, evaluation, or treatment in the ED at this time prior to  discharge.  Follow-up: Age greater than 40 -- GI/PCP referral for sigmoidoscopy or colonoscopy   New Prescriptions Discharge Medication List as of 10/27/2016 12:13 AM    START taking these medications   Details  dicyclomine (BENTYL) 20 MG tablet Take 1 tablet (20 mg total) by mouth 2 (two) times daily., Starting Wed 10/27/2016, Print    omeprazole (PRILOSEC) 20 MG capsule Take 1 capsule (20 mg total) by mouth daily., Starting Wed 10/27/2016, Print         Renne Crigler, PA-C 10/27/16 4782    Arby Barrette, MD 10/28/16 [phone]

## 2016-12-23 ENCOUNTER — Emergency Department (HOSPITAL_COMMUNITY)
Admission: EM | Admit: 2016-12-23 | Discharge: 2016-12-24 | Disposition: A | Discharge status: Home / Self Care | Payer: Self-pay | Attending: Emergency Medicine | Admitting: Emergency Medicine

## 2016-12-23 ENCOUNTER — Emergency Department (HOSPITAL_COMMUNITY): Payer: Self-pay

## 2016-12-23 ENCOUNTER — Encounter (HOSPITAL_COMMUNITY): Payer: Self-pay | Admitting: Emergency Medicine

## 2016-12-23 DIAGNOSIS — Z79899 Other long term (current) drug therapy: Secondary | ICD-10-CM | POA: Insufficient documentation

## 2016-12-23 DIAGNOSIS — R1084 Generalized abdominal pain: Secondary | ICD-10-CM | POA: Insufficient documentation

## 2016-12-23 LAB — URINALYSIS, ROUTINE W REFLEX MICROSCOPIC
Bilirubin Urine: NEGATIVE
GLUCOSE, UA: NEGATIVE mg/dL
Hgb urine dipstick: NEGATIVE
KETONES UR: 5 mg/dL — AB
Nitrite: NEGATIVE
PH: 5 (ref 5.0–8.0)
Protein, ur: 30 mg/dL — AB
SPECIFIC GRAVITY, URINE: 1.023 (ref 1.005–1.030)

## 2016-12-23 LAB — CBC
HCT: 41.8 % (ref 39.0–52.0)
Hemoglobin: 13.9 g/dL (ref 13.0–17.0)
MCH: 22.7 pg — AB (ref 26.0–34.0)
MCHC: 33.3 g/dL (ref 30.0–36.0)
MCV: 68.2 fL — ABNORMAL LOW (ref 78.0–100.0)
PLATELETS: 399 10*3/uL (ref 150–400)
RBC: 6.13 MIL/uL — AB (ref 4.22–5.81)
RDW: 13.2 % (ref 11.5–15.5)
WBC: 13.1 10*3/uL — AB (ref 4.0–10.5)

## 2016-12-23 LAB — COMPREHENSIVE METABOLIC PANEL
ALK PHOS: 124 U/L (ref 38–126)
ALT: 40 U/L (ref 17–63)
AST: 40 U/L (ref 15–41)
Albumin: 4.1 g/dL (ref 3.5–5.0)
Anion gap: 11 (ref 5–15)
BUN: 12 mg/dL (ref 6–20)
CALCIUM: 9.6 mg/dL (ref 8.9–10.3)
CO2: 23 mmol/L (ref 22–32)
CREATININE: 1.13 mg/dL (ref 0.61–1.24)
Chloride: 102 mmol/L (ref 101–111)
Glucose, Bld: 96 mg/dL (ref 65–99)
Potassium: 4.2 mmol/L (ref 3.5–5.1)
Sodium: 136 mmol/L (ref 135–145)
Total Bilirubin: 1 mg/dL (ref 0.3–1.2)
Total Protein: 8.6 g/dL — ABNORMAL HIGH (ref 6.5–8.1)

## 2016-12-23 LAB — LIPASE, BLOOD: Lipase: 29 U/L (ref 11–51)

## 2016-12-23 MED ORDER — MORPHINE SULFATE (PF) 4 MG/ML IV SOLN
4.0000 mg | Freq: Once | INTRAVENOUS | Status: AC
  Administered 2016-12-23: 4 mg via INTRAVENOUS
  Filled 2016-12-23: qty 1

## 2016-12-23 MED ORDER — DICYCLOMINE HCL 20 MG PO TABS: 20.0000 mg | ORAL_TABLET | Freq: Two times a day (BID) | ORAL | 0 refills | Status: DC

## 2016-12-23 MED ORDER — IOPAMIDOL (ISOVUE-300) INJECTION 61%
INTRAVENOUS | Status: AC
  Administered 2016-12-23: 100 mL via INTRAVENOUS
  Filled 2016-12-23: qty 100

## 2016-12-23 MED ORDER — SODIUM CHLORIDE 0.9 % IV BOLUS (SEPSIS)
1000.0000 mL | Freq: Once | INTRAVENOUS | Status: AC
  Administered 2016-12-23: 1000 mL via INTRAVENOUS

## 2016-12-23 NOTE — Discharge Instructions (Signed)
Please read attached information regarding your condition. Take Bentyl as needed for discomfort. Follow up with GI specialist listed below for further evaluation if symptoms persist. Return to ED for worsening pain, vomiting, bowel changes, blood in stool.

## 2016-12-23 NOTE — ED Notes (Signed)
Pt called for recheck v/s, no response from lobby 

## 2016-12-23 NOTE — ED Triage Notes (Signed)
Per family member, patient c/o sharp constant generalized abdominal pain x2 weeks. Denies N/V/D. Ambulatory.

## 2016-12-23 NOTE — ED Provider Notes (Signed)
[facility]-EMERGENCY DEPT Provider Note   CSN: [id number] Arrival date & time: 12/23/16  1301     History   Chief Complaint Chief Complaint  Patient presents with  . Abdominal Pain    HPI Nilsa NuttingKyok Dotts is a 46 y.o. male who presents to ED for evaluation of one-week history of generalized, sharp abdominal pai associated with decreased appetite and subjective fevers.  He states that the pain began suddenly.  Rates the pain at 7/10.  He has tried Tylenol and Advil with no relief in his symptoms.  He also reports generalized body aches.  He denies any bowel changes, urinary symptoms, nausea, vomiting, chest pain, shortness of breath, prior abdominal surgeries, alcohol, tobacco or other drug use.  HPI  Past Medical History:  Diagnosis Date  . Generalized headaches     There are no active problems to display for this patient.   History reviewed. No pertinent surgical history.     Home Medications    Prior to Admission medications   Medication Sig Start Date End Date Taking? Authorizing Provider  acetaminophen (TYLENOL) 500 MG tablet Take 500-1,000 mg by mouth every 6 (six) hours as needed (for pain or headaches).   Yes [provider]  ibuprofen (ADVIL,MOTRIN) 200 MG tablet Take 400 mg by mouth daily as needed for mild pain.   Yes [provider]  Pseudoeph-Doxylamine-DM-APAP (NYQUIL PO) Take 2 tablets by mouth daily as needed.   Yes [provider]  Pseudoephedrine-APAP-DM (DAYQUIL PO) Take 2 tablets by mouth daily as needed (cold symptoms).   Yes [provider]  dicyclomine (BENTYL) 20 MG tablet Take 1 tablet (20 mg total) by mouth 2 (two) times daily. 12/23/16   Rickey Sadowski, PA-C  omeprazole (PRILOSEC) 20 MG capsule Take 1 capsule (20 mg total) by mouth daily. Patient not taking: Reported on 12/23/2016 10/27/16   Renne CriglerGeiple, Joshua, PA-C    Family History No family history on file.  Social History Social History    Tobacco Use  . Smoking status: Never Smoker  . Smokeless tobacco: Never Used  Substance Use Topics  . Alcohol use: No  . Drug use: No     Allergies   Bee venom   Review of Systems Review of Systems  Constitutional: Positive for chills and fatigue. Negative for appetite change and fever.  HENT: Negative for ear pain, rhinorrhea, sneezing and sore throat.   Eyes: Negative for photophobia and visual disturbance.  Respiratory: Negative for cough, chest tightness, shortness of breath and wheezing.   Cardiovascular: Negative for chest pain and palpitations.  Gastrointestinal: Positive for abdominal pain. Negative for blood in stool, constipation, diarrhea, nausea and vomiting.  Genitourinary: Negative for dysuria, hematuria and urgency.  Musculoskeletal: Positive for myalgias.  Skin: Negative for rash.  Neurological: Negative for dizziness, weakness and light-headedness.     Physical Exam Updated Vital Signs BP (!) 140/91 (BP Location: Left Arm)   Pulse 78   Temp 99.4 F (37.4 C) (Oral)   Resp 18   SpO2 95%   Physical Exam  Constitutional: He appears well-developed and well-nourished. No distress.  HENT:  Head: Normocephalic and atraumatic.  Nose: Nose normal.  Eyes: Conjunctivae and EOM are normal. Left eye exhibits no discharge. No scleral icterus.  Neck: Normal range of motion. Neck supple.  Cardiovascular: Normal rate, regular rhythm, normal heart sounds and intact distal pulses. Exam reveals no gallop and no friction rub.  No murmur heard. Pulmonary/Chest: Effort normal and breath sounds normal. No  respiratory distress.  Abdominal: Soft. Bowel sounds are normal. He exhibits no distension. There is tenderness (Epigastric and periumbilical). There is no guarding.  Musculoskeletal: Normal range of motion. He exhibits no edema.  Neurological: He is alert. He exhibits normal muscle tone. Coordination normal.  Skin: Skin is warm and dry. No rash noted.  Psychiatric: He  has a normal mood and affect.  Nursing note and vitals reviewed.    ED Treatments / Results  Labs (all labs ordered are listed, but only abnormal results are displayed) Labs Reviewed  COMPREHENSIVE METABOLIC PANEL - Abnormal; Notable for the following components:      Result Value   Total Protein 8.6 (*)    All other components within normal limits  CBC - Abnormal; Notable for the following components:   WBC 13.1 (*)    RBC 6.13 (*)    MCV 68.2 (*)    MCH 22.7 (*)    All other components within normal limits  URINALYSIS, ROUTINE W REFLEX MICROSCOPIC - Abnormal; Notable for the following components:   Color, Urine AMBER (*)    APPearance HAZY (*)    Ketones, ur 5 (*)    Protein, ur 30 (*)    Leukocytes, UA LARGE (*)    Bacteria, UA RARE (*)    Squamous Epithelial / LPF 0-5 (*)    All other components within normal limits  URINE CULTURE  LIPASE, BLOOD    EKG  EKG Interpretation None       Radiology Ct Abdomen Pelvis W Contrast  Result Date: 12/23/2016 CLINICAL DATA:  46 y/o M; sharp constant generalized abdominal pain for 2 weeks. EXAM: CT ABDOMEN AND PELVIS WITH CONTRAST TECHNIQUE: Multidetector CT imaging of the abdomen and pelvis was performed using the standard protocol following bolus administration of intravenous contrast. CONTRAST:  ISOVUE-300 IOPAMIDOL (ISOVUE-300) INJECTION 61% COMPARISON:  10/26/2016 CT abdomen and pelvis. FINDINGS: Lower chest: No acute abnormality. Hepatobiliary: No focal liver abnormality is seen. No gallstones, gallbladder wall thickening, or biliary dilatation. Pancreas: Unremarkable. No pancreatic ductal dilatation or surrounding inflammatory changes. Spleen: Signal stable nonspecific calcification in lower spleen, likely sequelae of old granulomatous disease. Adrenals/Urinary Tract: Adrenal glands are unremarkable. Kidneys are normal, without renal calculi, focal lesion, or hydronephrosis. Bladder is unremarkable. Stomach/Bowel: Stomach  is within normal limits. Appendix appears normal. No evidence of bowel wall thickening, distention, or inflammatory changes. Vascular/Lymphatic: Mild increased prominence of upper abdominal retroperitoneal lymph nodes. Reproductive: Prostate is unremarkable. Other: No abdominal wall hernia or abnormality. No abdominopelvic ascites. Musculoskeletal: No acute or significant osseous findings. IMPRESSION: Interval mild enlargement of upper abdominal retroperitoneal lymph nodes, likely reactive to underlying inflammatory process such as enteritis. Otherwise no acute process identified. Electronically Signed   By: Mitzi Hansen M.D.   On: 12/23/2016 22:01    Procedures Procedures (including critical care time)  Medications Ordered in ED Medications  sodium chloride 0.9 % bolus 1,000 mL (0 mLs Intravenous Stopped 12/23/16 1941)  morphine 4 MG/ML injection 4 mg (4 mg Intravenous Given 12/23/16 1825)  iopamidol (ISOVUE-300) 61 % injection (100 mLs Intravenous Contrast Given 12/23/16 2106)     Initial Impression / Assessment and Plan / ED Course  I have reviewed the triage vital signs and the nursing notes.  Pertinent labs & imaging results that were available during my care of the patient were reviewed by me and considered in my medical decision making (see chart for details).     Patient presents to ED for evaluation of 1  week history of generalized, sharp abdominal pain associated with subjective fevers.  He has tried Tylenol and ibuprofen with no relief in his symptoms.  On physical exam patient is overall well-appearing.  He does have some epigastric and periumbilical tenderness to palpation.  CBC has leukocytosis at 13.1 which is likely reactive.  CMP is unremarkable.  Lipase unremarkable.  Urinalysis with leukocytes and bacteria will send for culture.  CT of the abdomen and pelvis showed findings consistent with possible enteritis.  Patient pain controlled here in the ED.  Will discharge  with Bentyl and advised him to follow-up with GI specialist for further evaluation this patient has had these issues before.  Advised patient to follow-up with results of urine culture when available.  Patient appears stable for discharge at this time.  Strict return precautions given.  Final Clinical Impressions(s) / ED Diagnoses   Final diagnoses:  Generalized abdominal pain    ED Discharge Orders        Ordered    dicyclomine (BENTYL) 20 MG tablet  2 times daily     12/23/16 2251       Dietrich PatesKhatri, Mckenze Slone, PA-C 12/23/16 2330    Lorre NickAllen, Anthony, MD 12/27/16 [phone]

## 2016-12-25 LAB — URINE CULTURE

## 2017-01-01 ENCOUNTER — Emergency Department (HOSPITAL_COMMUNITY)
Admission: EM | Admit: 2017-01-01 | Discharge: 2017-01-01 | Disposition: A | Discharge status: Home / Self Care | Payer: Self-pay | Attending: Emergency Medicine | Admitting: Emergency Medicine

## 2017-01-01 ENCOUNTER — Emergency Department (HOSPITAL_COMMUNITY): Payer: Self-pay

## 2017-01-01 ENCOUNTER — Other Ambulatory Visit: Payer: Self-pay

## 2017-01-01 ENCOUNTER — Encounter (HOSPITAL_COMMUNITY): Payer: Self-pay | Admitting: Emergency Medicine

## 2017-01-01 DIAGNOSIS — R509 Fever, unspecified: Secondary | ICD-10-CM

## 2017-01-01 DIAGNOSIS — K122 Cellulitis and abscess of mouth: Secondary | ICD-10-CM | POA: Insufficient documentation

## 2017-01-01 LAB — CBC WITH DIFFERENTIAL/PLATELET
BASOS PCT: 0 %
Basophils Absolute: 0 10*3/uL (ref 0.0–0.1)
EOS ABS: 0.3 10*3/uL (ref 0.0–0.7)
Eosinophils Relative: 3 %
HCT: 37.1 % — ABNORMAL LOW (ref 39.0–52.0)
Hemoglobin: 11.9 g/dL — ABNORMAL LOW (ref 13.0–17.0)
LYMPHS ABS: 1.7 10*3/uL (ref 0.7–4.0)
Lymphocytes Relative: 15 %
MCH: 21.6 pg — AB (ref 26.0–34.0)
MCHC: 32.1 g/dL (ref 30.0–36.0)
MCV: 67.3 fL — ABNORMAL LOW (ref 78.0–100.0)
MONO ABS: 0.8 10*3/uL (ref 0.1–1.0)
Monocytes Relative: 7 %
NEUTROS PCT: 75 %
Neutro Abs: 8.8 10*3/uL — ABNORMAL HIGH (ref 1.7–7.7)
PLATELETS: 400 10*3/uL (ref 150–400)
RBC: 5.51 MIL/uL (ref 4.22–5.81)
RDW: 13.2 % (ref 11.5–15.5)
WBC: 11.6 10*3/uL — AB (ref 4.0–10.5)

## 2017-01-01 LAB — I-STAT CG4 LACTIC ACID, ED
Lactic Acid, Venous: 0.85 mmol/L (ref 0.5–1.9)
Lactic Acid, Venous: 1.06 mmol/L (ref 0.5–1.9)

## 2017-01-01 LAB — COMPREHENSIVE METABOLIC PANEL
ALK PHOS: 111 U/L (ref 38–126)
ALT: 44 U/L (ref 17–63)
AST: 30 U/L (ref 15–41)
Albumin: 3.3 g/dL — ABNORMAL LOW (ref 3.5–5.0)
Anion gap: 9 (ref 5–15)
BUN: 9 mg/dL (ref 6–20)
CALCIUM: 8.8 mg/dL — AB (ref 8.9–10.3)
CO2: 23 mmol/L (ref 22–32)
CREATININE: 0.85 mg/dL (ref 0.61–1.24)
Chloride: 101 mmol/L (ref 101–111)
Glucose, Bld: 106 mg/dL — ABNORMAL HIGH (ref 65–99)
Potassium: 3.7 mmol/L (ref 3.5–5.1)
Sodium: 133 mmol/L — ABNORMAL LOW (ref 135–145)
Total Bilirubin: 0.6 mg/dL (ref 0.3–1.2)
Total Protein: 8 g/dL (ref 6.5–8.1)

## 2017-01-01 LAB — RAPID STREP SCREEN (MED CTR MEBANE ONLY): STREPTOCOCCUS, GROUP A SCREEN (DIRECT): NEGATIVE

## 2017-01-01 MED ORDER — ACETAMINOPHEN 325 MG PO TABS
650.0000 mg | ORAL_TABLET | Freq: Once | ORAL | Status: AC
  Administered 2017-01-01: 650 mg via ORAL
  Filled 2017-01-01: qty 2

## 2017-01-01 MED ORDER — DEXAMETHASONE SODIUM PHOSPHATE 10 MG/ML IJ SOLN
10.0000 mg | Freq: Once | INTRAMUSCULAR | Status: AC
  Administered 2017-01-01: 10 mg via INTRAVENOUS
  Filled 2017-01-01: qty 1

## 2017-01-01 MED ORDER — SODIUM CHLORIDE 0.9 % IV BOLUS (SEPSIS)
1000.0000 mL | Freq: Once | INTRAVENOUS | Status: AC
  Administered 2017-01-01: 1000 mL via INTRAVENOUS

## 2017-01-01 MED ORDER — AMOXICILLIN-POT CLAVULANATE 875-125 MG PO TABS: 1.0000 | ORAL_TABLET | Freq: Two times a day (BID) | ORAL | 0 refills | Status: DC

## 2017-01-01 NOTE — ED Triage Notes (Addendum)
Pt speaks Jari. Pt seen at [facility] for abdominal pain 11/29 and seen December for same. Pt states yesterday had new onset sore throat. Pt also states fever of 102. Pt has had fevers off and on since abdominal pain. Pt denies abdominal pain at present.Pt states fevers for 1 month total now and denies any periods where he felt better or did not have a fever. Has been taking tylenol and motrin.

## 2017-01-01 NOTE — ED Notes (Signed)
Patient given discharge instructions and verbalized understanding.  Patient stable to discharge at this time.  Patient is alert and oriented to baseline.  No distressed noted at this time.  All belongings taken with the patient at discharge.   

## 2017-01-01 NOTE — ED Provider Notes (Signed)
[facility] Provider Note   CSN: [id number] Arrival date & time: 01/01/17  [id number]    History   Chief Complaint Chief Complaint  Patient presents with  . Sore Throat  . Fever  . Generalized Body Aches    HPI Danny Kim is a 46 y.o. male who presents with fever, chills, and sore throat. No significant PMH. He states that he has been having fever and chills for several weeks. He was seen on 10/27/16 for abdominal pain and hematochezia. CT obtained was negative and patient was referred to GI. He came to the ED again on 12/23/16 for abdominal pain again. He again had a CT scan which showed enlarged upper abdominal lymph nodes possibly consistent with enteritis. He states since discharged he has not felt much better. He reports ongoing fever, chills, body aches. He does state that his abdominal pain has resolved. He denies any follow up with a PCP or making appointment with GI. He states that last night he developed a sore throat and feels like his uvula is swollen and he has difficulty swallowing and breathing at times. He has been taking Tylenol, Motrin, and OTC cold medicines. No sick contacts or recent travel. No hx of angioedema.  HPI  Past Medical History:  Diagnosis Date  . Generalized headaches     There are no active problems to display for this patient.   No past surgical history on file.     Home Medications    Prior to Admission medications   Medication Sig Start Date End Date Taking? Authorizing Provider  acetaminophen (TYLENOL) 500 MG tablet Take 500-1,000 mg by mouth every 6 (six) hours as needed (for pain or headaches).    [provider]  dicyclomine (BENTYL) 20 MG tablet Take 1 tablet (20 mg total) by mouth 2 (two) times daily. 12/23/16   Khatri, Hina, PA-C  ibuprofen (ADVIL,MOTRIN) 200 MG tablet Take 400 mg by mouth daily as needed for mild pain.    [provider]  omeprazole (PRILOSEC) 20 MG capsule Take 1  capsule (20 mg total) by mouth daily. Patient not taking: Reported on 12/23/2016 10/27/16   Renne CriglerGeiple, Joshua, PA-C  Pseudoeph-Doxylamine-DM-APAP (NYQUIL PO) Take 2 tablets by mouth daily as needed.    [provider]  Pseudoephedrine-APAP-DM (DAYQUIL PO) Take 2 tablets by mouth daily as needed (cold symptoms).    [provider]    Family History No family history on file.  Social History Social History   Tobacco Use  . Smoking status: Never Smoker  . Smokeless tobacco: Never Used  Substance Use Topics  . Alcohol use: No  . Drug use: No     Allergies   Bee venom   Review of Systems Review of Systems  Constitutional: Positive for chills, diaphoresis and fever.  HENT: Positive for sore throat and trouble swallowing. Negative for congestion, ear pain and rhinorrhea.   Respiratory: Negative for cough and shortness of breath.   Cardiovascular: Negative for chest pain.  Gastrointestinal: Negative for abdominal pain, diarrhea, nausea and vomiting.  Genitourinary: Negative for dysuria.  All other systems reviewed and are negative.    Physical Exam Updated Vital Signs BP 124/90 (BP Location: Right Arm)   Pulse 96   Temp 99.2 F (37.3 C) (Oral)   Resp 16   SpO2 99%   Physical Exam  Constitutional: He is oriented to person, place, and time. He appears well-developed and well-nourished. No distress.  HENT:  Head: Normocephalic and  atraumatic.  Right Ear: Hearing, tympanic membrane, external ear and ear canal normal.  Left Ear: Hearing, tympanic membrane, external ear and ear canal normal.  Nose: Nose normal.  Mouth/Throat: Uvula is midline, oropharynx is clear and moist and mucous membranes are normal. Uvula swelling present.  Eyes: Conjunctivae are normal. Pupils are equal, round, and reactive to light. Right eye exhibits no discharge. Left eye exhibits no discharge. No scleral icterus.  Neck: Normal range of motion.  Cardiovascular: Normal rate and regular  rhythm. Exam reveals no gallop and no friction rub.  No murmur heard. Pulmonary/Chest: Effort normal and breath sounds normal. No stridor. No respiratory distress. He has no wheezes. He has no rales. He exhibits no tenderness.  Abdominal: Soft. Bowel sounds are normal. He exhibits no distension. There is no tenderness.  Neurological: He is alert and oriented to person, place, and time.  Skin: Skin is warm. He is diaphoretic.  Psychiatric: He has a normal mood and affect. His behavior is normal.  Nursing note and vitals reviewed.    ED Treatments / Results  Labs (all labs ordered are listed, but only abnormal results are displayed) Labs Reviewed  COMPREHENSIVE METABOLIC PANEL - Abnormal; Notable for the following components:      Result Value   Sodium 133 (*)    Glucose, Bld 106 (*)    Calcium 8.8 (*)    Albumin 3.3 (*)    All other components within normal limits  CBC WITH DIFFERENTIAL/PLATELET - Abnormal; Notable for the following components:   WBC 11.6 (*)    Hemoglobin 11.9 (*)    HCT 37.1 (*)    MCV 67.3 (*)    MCH 21.6 (*)    Neutro Abs 8.8 (*)    All other components within normal limits  RAPID STREP SCREEN (NOT AT [facility])  CULTURE, GROUP A STREP ([facility])  HIV ANTIBODY (ROUTINE TESTING)  I-STAT CG4 LACTIC ACID, ED  I-STAT CG4 LACTIC ACID, ED    EKG  EKG Interpretation None       Radiology Dg Neck Soft Tissue  Result Date: 01/01/2017 CLINICAL DATA:  46 year old male with fever and sore throat. EXAM: NECK SOFT TISSUES - 1+ VIEW COMPARISON:  None. FINDINGS: There is no evidence of retropharyngeal soft tissue swelling or epiglottic enlargement. The cervical airway is unremarkable and no radio-opaque foreign body identified. IMPRESSION: Negative. Electronically Signed   By: Sande BrothersSerena  Chacko M.D.   On: 01/01/2017 12:54    Procedures Procedures (including critical care time)  Medications Ordered in ED Medications  sodium chloride 0.9 % bolus 1,000 mL (0 mLs Intravenous  Stopped 01/01/17 1307)  acetaminophen (TYLENOL) tablet 650 mg (650 mg Oral Given 01/01/17 1217)  dexamethasone (DECADRON) injection 10 mg (10 mg Intravenous Given 01/01/17 1334)     Initial Impression / Assessment and Plan / ED Course  I have reviewed the triage vital signs and the nursing notes.  Pertinent labs & imaging results that were available during my care of the patient were reviewed by me and considered in my medical decision making (see chart for details).  46 year old male presents with fever, chills, sore throat, and swollen uvula. Vitals are normal. Exam is remarkable for mildly swollen uvula. He is able to speak and tolerated PO in the ED. CBC is remarkable for mild leukocytosis of 11.6 and mild anemia. CMP is remarkable for mild hyponatremia. Lactic acid is normal. Rapid strep is negative. DG soft tissue neck was normal. He was given IVF and decadron  in the ED. He will be given rx for Augmentin to cover for possible bacterial infection. He was strongly encouraged to follow up with a primary doctor. I discussed this with his wife and son as well. He was given return precautions for shortness of breath/inability to swallow.  Final Clinical Impressions(s) / ED Diagnoses   Final diagnoses:  Uvulitis  Fever and chills    ED Discharge Orders    None       Bethel Born, PA-C 01/01/17 1425    Cardama, Amadeo Garnet, MD 01/02/17 1731

## 2017-01-01 NOTE — Discharge Instructions (Signed)
Please take antibiotic for the next week Please follow up with primary doctor - a referral has been given. Drink plenty of fluids to prevent dehydration Continue Tylenol and Ibuprofen for pain/fever/chills Return if worsening

## 2017-01-02 LAB — HIV ANTIBODY (ROUTINE TESTING W REFLEX): HIV SCREEN 4TH GENERATION: NONREACTIVE

## 2017-01-03 LAB — CULTURE, GROUP A STREP (THRC)

## 2018-01-12 ENCOUNTER — Other Ambulatory Visit: Payer: Self-pay

## 2018-01-12 ENCOUNTER — Encounter (HOSPITAL_COMMUNITY): Payer: Self-pay

## 2018-01-12 ENCOUNTER — Emergency Department (HOSPITAL_COMMUNITY)
Admission: EM | Admit: 2018-01-12 | Discharge: 2018-01-12 | Disposition: A | Discharge status: Home / Self Care | Payer: Self-pay | Attending: Emergency Medicine | Admitting: Emergency Medicine

## 2018-01-12 DIAGNOSIS — Z79899 Other long term (current) drug therapy: Secondary | ICD-10-CM | POA: Insufficient documentation

## 2018-01-12 DIAGNOSIS — M10071 Idiopathic gout, right ankle and foot: Secondary | ICD-10-CM | POA: Insufficient documentation

## 2018-01-12 MED ORDER — HYDROCODONE-ACETAMINOPHEN 5-325 MG PO TABS: 1.0000 | ORAL_TABLET | Freq: Four times a day (QID) | ORAL | 0 refills | Status: DC | PRN

## 2018-01-12 MED ORDER — PREDNISONE 50 MG PO TABS: 50.0000 mg | ORAL_TABLET | Freq: Every day | ORAL | 0 refills | Status: DC

## 2018-01-12 MED ORDER — OXYCODONE-ACETAMINOPHEN 5-325 MG PO TABS
1.0000 | ORAL_TABLET | Freq: Once | ORAL | Status: AC
  Administered 2018-01-12: 1 via ORAL
  Filled 2018-01-12: qty 1

## 2018-01-12 NOTE — Discharge Instructions (Addendum)
Return here as needed. Ice and elevate the foot.

## 2018-01-12 NOTE — ED Triage Notes (Signed)
Pt here for right sided foot pain for the last 2 days.  Denies any falls or trauma to the foot. A&Ox4.

## 2018-01-13 ENCOUNTER — Other Ambulatory Visit: Payer: Self-pay

## 2018-01-13 ENCOUNTER — Emergency Department (HOSPITAL_COMMUNITY): Payer: Self-pay

## 2018-01-13 ENCOUNTER — Emergency Department (HOSPITAL_COMMUNITY)
Admission: EM | Admit: 2018-01-13 | Discharge: 2018-01-13 | Disposition: A | Discharge status: Home / Self Care | Payer: Self-pay | Attending: Emergency Medicine | Admitting: Emergency Medicine

## 2018-01-13 ENCOUNTER — Encounter (HOSPITAL_COMMUNITY): Payer: Self-pay

## 2018-01-13 DIAGNOSIS — M10071 Idiopathic gout, right ankle and foot: Secondary | ICD-10-CM | POA: Insufficient documentation

## 2018-01-13 DIAGNOSIS — Z79899 Other long term (current) drug therapy: Secondary | ICD-10-CM | POA: Insufficient documentation

## 2018-01-13 LAB — I-STAT CHEM 8, ED
BUN: 17 mg/dL (ref 6–20)
CALCIUM ION: 1.16 mmol/L (ref 1.15–1.40)
CHLORIDE: 103 mmol/L (ref 98–111)
CREATININE: 1.1 mg/dL (ref 0.61–1.24)
GLUCOSE: 79 mg/dL (ref 70–99)
HCT: 45 % (ref 39.0–52.0)
Hemoglobin: 15.3 g/dL (ref 13.0–17.0)
Potassium: 3.3 mmol/L — ABNORMAL LOW (ref 3.5–5.1)
Sodium: 140 mmol/L (ref 135–145)
TCO2: 28 mmol/L (ref 22–32)

## 2018-01-13 MED ORDER — ONDANSETRON 4 MG PO TBDP
4.0000 mg | ORAL_TABLET | Freq: Once | ORAL | Status: AC
  Administered 2018-01-13: 4 mg via ORAL
  Filled 2018-01-13: qty 1

## 2018-01-13 MED ORDER — COLCHICINE 0.6 MG PO TABS
1.2000 mg | ORAL_TABLET | Freq: Once | ORAL | Status: AC
  Administered 2018-01-13: 1.2 mg via ORAL
  Filled 2018-01-13: qty 2

## 2018-01-13 MED ORDER — COLCHICINE 0.6 MG PO TABS: 0.6000 mg | ORAL_TABLET | Freq: Every day | ORAL | 0 refills | Status: DC

## 2018-01-13 MED ORDER — HYDROMORPHONE HCL 1 MG/ML IJ SOLN
1.0000 mg | Freq: Once | INTRAMUSCULAR | Status: AC
  Administered 2018-01-13: 1 mg via INTRAMUSCULAR
  Filled 2018-01-13: qty 1

## 2018-01-13 MED ORDER — INDOMETHACIN 25 MG PO CAPS
50.0000 mg | ORAL_CAPSULE | Freq: Once | ORAL | Status: AC
  Administered 2018-01-13: 50 mg via ORAL
  Filled 2018-01-13: qty 2

## 2018-01-13 MED ORDER — INDOMETHACIN 50 MG PO CAPS: 50.0000 mg | ORAL_CAPSULE | Freq: Three times a day (TID) | ORAL | 0 refills | Status: DC

## 2018-01-13 MED ORDER — OXYCODONE-ACETAMINOPHEN 5-325 MG PO TABS
2.0000 | ORAL_TABLET | Freq: Once | ORAL | Status: AC
  Administered 2018-01-13: 2 via ORAL
  Filled 2018-01-13: qty 2

## 2018-01-13 NOTE — ED Provider Notes (Signed)
TIME SEEN: 2:23 AM  CHIEF COMPLAINT: Right foot pain  HPI: Patient is a 47 year old male with no significant past medical history who presents to the emergency department with right foot pain.  Was seen here yesterday for the same.  Was diagnosed with gout.  Was discharged with prescription for hydrocodone which she has not yet filled.  States pain became more intense and so he returned to the emergency department.  Denies any known injury to the foot.  Has never had gout before.  Had redness and warmth to the right great toe.  Denies any fever.  No chest pain or shortness of breath.  Unable to ambulate.  Family states they thought they had crutches at home but are not sure now.  ROS: See HPI Constitutional: no fever  Eyes: no drainage  ENT: no runny nose   Cardiovascular:  no chest pain  Resp: no SOB  GI: no vomiting GU: no dysuria Integumentary: no rash  Allergy: no hives  Musculoskeletal: no leg swelling  Neurological: no slurred speech ROS otherwise negative  PAST MEDICAL HISTORY/PAST SURGICAL HISTORY:  Past Medical History:  Diagnosis Date  . Generalized headaches     MEDICATIONS:  Prior to Admission medications   Medication Sig Start Date End Date Taking? Authorizing Provider  acetaminophen (TYLENOL) 500 MG tablet Take 500-1,000 mg by mouth every 6 (six) hours as needed (for pain or headaches).    [provider]  amoxicillin-clavulanate (AUGMENTIN) 875-125 MG tablet Take 1 tablet by mouth every 12 (twelve) hours. 01/01/17   Bethel BornGekas, Kelly Marie, PA-C  dicyclomine (BENTYL) 20 MG tablet Take 1 tablet (20 mg total) by mouth 2 (two) times daily. 12/23/16   Khatri, Hina, PA-C  HYDROcodone-acetaminophen (NORCO/VICODIN) 5-325 MG tablet Take 1 tablet by mouth every 6 (six) hours as needed for moderate pain. 01/12/18   Lawyer, Cristal Deerhristopher, PA-C  ibuprofen (ADVIL,MOTRIN) 200 MG tablet Take 400 mg by mouth daily as needed for mild pain.    [provider]  omeprazole  (PRILOSEC) 20 MG capsule Take 1 capsule (20 mg total) by mouth daily. Patient not taking: Reported on 12/23/2016 10/27/16   Renne CriglerGeiple, Joshua, PA-C  predniSONE (DELTASONE) 50 MG tablet Take 1 tablet (50 mg total) by mouth daily with breakfast. 01/12/18   Lawyer, Cristal Deerhristopher, PA-C  Pseudoeph-Doxylamine-DM-APAP (NYQUIL PO) Take 2 tablets by mouth daily as needed.    [provider]  Pseudoephedrine-APAP-DM (DAYQUIL PO) Take 2 tablets by mouth daily as needed (cold symptoms).    [provider]    ALLERGIES:  Allergies  Allergen Reactions  . Bee Venom Anaphylaxis    SOCIAL HISTORY:  Social History   Tobacco Use  . Smoking status: Never Smoker  . Smokeless tobacco: Never Used  Substance Use Topics  . Alcohol use: No    FAMILY HISTORY: History reviewed. No pertinent family history.  EXAM: BP (!) 167/97 (BP Location: Right Arm)   Pulse 79   Temp 97.9 F (36.6 C) (Oral)   Resp 20   SpO2 100%  CONSTITUTIONAL: Alert and oriented and responds appropriately to questions. Well-appearing; well-nourished HEAD: Normocephalic EYES: Conjunctivae clear, pupils appear equal, EOMI ENT: normal nose; moist mucous membranes NECK: Supple, no meningismus, no nuchal rigidity, no LAD  CARD: RRR; S1 and S2 appreciated; no murmurs, no clicks, no rubs, no gallops RESP: Normal chest excursion without splinting or tachypnea; breath sounds clear and equal bilaterally; no wheezes, no rhonchi, no rales, no hypoxia or respiratory distress, speaking full sentences ABD/GI: Normal bowel  sounds; non-distended; soft, non-tender, no rebound, no guarding, no peritoneal signs, no hepatosplenomegaly BACK:  The back appears normal and is non-tender to palpation, there is no CVA tenderness EXT: Tender to palpation over the dorsal right great toe with redness and warmth over the MTP.  No obvious bony deformity.  2+ right DP pulse on exam.  Compartments in the right leg are soft.  No joint effusion noted.   Normal ROM in all joints; otherwise right extremity is non-tender to palpation; no edema; normal capillary refill; no cyanosis, no calf tenderness or swelling    SKIN: Normal color for age and race; warm; no rash NEURO: Moves all extremities equally, normal sensation in the right leg PSYCH: The patient's mood and manner are appropriate. Grooming and personal hygiene are appropriate.  MEDICAL DECISION MAKING: Patient here with right foot pain.  Suspect gout.  Will obtain x-ray to ensure there is no underlying fracture.  I doubt septic arthritis.  No sign of cellulitis.  Neurovascularly intact distally.  Doubt DVT.  No sign of compartment syndrome.  Will treat with IM Dilaudid for symptomatic relief and provide with crutches.  ED PROGRESS: Patient's pain is improved.  Labs, x-ray unremarkable.  Will discharge home with colchicine and as well as Indocin.  He has already been provided with a prescription for hydrocodone.  Given crutches here in the ED.   At this time, I do not feel there is any life-threatening condition present. I have reviewed and discussed all results (EKG, imaging, lab, urine as appropriate) and exam findings with patient/family. I have reviewed nursing notes and appropriate previous records.  I feel the patient is safe to be discharged home without further emergent workup and can continue workup as an outpatient as needed. Discussed usual and customary return precautions. Patient/family verbalize understanding and are comfortable with this plan.  Outpatient follow-up has been provided as needed. All questions have been answered.      Cortne Amara, Layla MawKristen N, DO 01/13/18 [phone]

## 2018-01-13 NOTE — ED Notes (Signed)
Crutches given with instruction and pt returned demonstration of proper use.

## 2018-01-13 NOTE — ED Notes (Signed)
ED Provider at bedside. 

## 2018-01-13 NOTE — Discharge Instructions (Signed)
Please take your pain medication as prescribed.  You may use crutches to help get around while you continue to have pain.  It may take several days even a week or more for your pain to significantly improve.  Your x-ray and labs today were normal.   Steps to find a Primary Care Provider (PCP):  Call [phone] or [phone] to access "[facility] Find a Doctor Service."  2.  You may also go on the [facility] website at [email or web address]  3.  [facility] also frequently accepts new patients.  [facility]  [address] [place] [id number] [phone]  4.  There are also multiple [facility], [place] [facility] practices throughout the [place] that are frequently accepting new patients. You may find a clinic that is close to your home and contact them.  [facility] [email or web address] [phone]  [email or web address] .[email or web address]  [facility] [email or web address] [phone]  [email or web address] [phone]  5.  Local Health Departments also can provide primary care services.  [facility]  [address] [place] [address] [phone]  [facility] [address] [place] [address] [phone]  [facility] [address] [place] [address]  [place] [phone]

## 2018-01-13 NOTE — ED Triage Notes (Signed)
Pt here for right foot pain, seen earlier for the same thing and stated 30 min after leave pain was not gone.  Pt A&Ox4, no new symptoms.

## 2018-01-16 NOTE — ED Provider Notes (Signed)
[facility] Provider Note   CSN: [id number] Arrival date & time: 01/12/18  1955     History   Chief Complaint Chief Complaint  Patient presents with  . Foot Pain    HPI Danny Kim is a 47 y.o. male.  HPI Patient presents to the emergency department with pain of the right great toe.  The patient states that the pain is worse with certain movements palpation.  The patient states that he has redness and warmth of the toe.  Patient states that he has increasing pain with ambulation.  Patient states he has had no fevers, nausea, vomiting, back pain, weakness, dizziness or syncope.  Patient did not take any medications prior to arrival for symptoms. Past Medical History:  Diagnosis Date  . Generalized headaches     There are no active problems to display for this patient.   History reviewed. No pertinent surgical history.      Home Medications    Prior to Admission medications   Medication Sig Start Date End Date Taking? Authorizing Provider  acetaminophen (TYLENOL) 500 MG tablet Take 500-1,000 mg by mouth every 6 (six) hours as needed (for pain or headaches).    [provider]  amoxicillin-clavulanate (AUGMENTIN) 875-125 MG tablet Take 1 tablet by mouth every 12 (twelve) hours. 01/01/17   Bethel BornGekas, Kelly Marie, PA-C  colchicine 0.6 MG tablet Take 1 tablet (0.6 mg total) by mouth daily. 01/13/18   Ward, Layla MawKristen N, DO  dicyclomine (BENTYL) 20 MG tablet Take 1 tablet (20 mg total) by mouth 2 (two) times daily. 12/23/16   Khatri, Hina, PA-C  HYDROcodone-acetaminophen (NORCO/VICODIN) 5-325 MG tablet Take 1 tablet by mouth every 6 (six) hours as needed for moderate pain. 01/12/18   Lorinda Copland, Cristal Deerhristopher, PA-C  ibuprofen (ADVIL,MOTRIN) 200 MG tablet Take 400 mg by mouth daily as needed for mild pain.    [provider]  indomethacin (INDOCIN) 50 MG capsule Take 1 capsule (50 mg total) by mouth 3 (three) times daily with meals.  01/13/18   Ward, Layla MawKristen N, DO  omeprazole (PRILOSEC) 20 MG capsule Take 1 capsule (20 mg total) by mouth daily. Patient not taking: Reported on 12/23/2016 10/27/16   Renne CriglerGeiple, Joshua, PA-C  predniSONE (DELTASONE) 50 MG tablet Take 1 tablet (50 mg total) by mouth daily with breakfast. 01/12/18   Khaliya Golinski, Cristal Deerhristopher, PA-C  Pseudoeph-Doxylamine-DM-APAP (NYQUIL PO) Take 2 tablets by mouth daily as needed.    [provider]  Pseudoephedrine-APAP-DM (DAYQUIL PO) Take 2 tablets by mouth daily as needed (cold symptoms).    [provider]    Family History History reviewed. No pertinent family history.  Social History Social History   Tobacco Use  . Smoking status: Never Smoker  . Smokeless tobacco: Never Used  Substance Use Topics  . Alcohol use: No  . Drug use: No     Allergies   Bee venom   Review of Systems Review of Systems All other systems negative except as documented in the HPI. All pertinent positives and negatives as reviewed in the HPI. Physical Exam Updated Vital Signs BP (!) 147/107   Pulse 82   Temp 97.7 F (36.5 C) (Oral)   Resp 18   SpO2 99%   Physical Exam Vitals signs and nursing note reviewed.  Constitutional:      General: He is not in acute distress.    Appearance: He is well-developed.  HENT:     Head: Normocephalic and atraumatic.  Eyes:  Pupils: Pupils are equal, round, and reactive to light.  Pulmonary:     Effort: Pulmonary effort is normal.  Musculoskeletal:       Feet:  Skin:    General: Skin is warm and dry.  Neurological:     Mental Status: He is alert and oriented to person, place, and time.      ED Treatments / Results  Labs (all labs ordered are listed, but only abnormal results are displayed) Labs Reviewed - No data to display  EKG None  Radiology No results found.  Procedures Procedures (including critical care time)  Medications Ordered in ED Medications  oxyCODONE-acetaminophen  (PERCOCET/ROXICET) 5-325 MG per tablet 1 tablet (1 tablet Oral Given 01/12/18 2124)     Initial Impression / Assessment and Plan / ED Course  I have reviewed the triage vital signs and the nursing notes.  Pertinent labs & imaging results that were available during my care of the patient were reviewed by me and considered in my medical decision making (see chart for details).     She most likely has gout based on the fact that he has increasing redness and warmth of the right great toe along with severe pain with light palpation.  There is no signs of infection at this time and I feel that the pain is most likely related to a gout flare.  Patient is advised of the plan and all questions were answered.  Told to return here as needed. Final Clinical Impressions(s) / ED Diagnoses   Final diagnoses:  Acute idiopathic gout involving toe of right foot    ED Discharge Orders         Ordered    HYDROcodone-acetaminophen (NORCO/VICODIN) 5-325 MG tablet  Every 6 hours PRN     01/12/18 2120    predniSONE (DELTASONE) 50 MG tablet  Daily with breakfast     01/12/18 2120           Charlestine NightLawyer, Shauni Henner, PA-C 01/16/18 0009    Vanetta MuldersZackowski, Scott, MD 01/20/18 [phone]

## 2018-06-18 ENCOUNTER — Encounter (HOSPITAL_COMMUNITY): Payer: Self-pay

## 2018-06-18 ENCOUNTER — Emergency Department (HOSPITAL_BASED_OUTPATIENT_CLINIC_OR_DEPARTMENT_OTHER): Payer: Self-pay

## 2018-06-18 ENCOUNTER — Emergency Department (HOSPITAL_COMMUNITY): Payer: Self-pay

## 2018-06-18 ENCOUNTER — Emergency Department (HOSPITAL_COMMUNITY)
Admission: EM | Admit: 2018-06-18 | Discharge: 2018-06-18 | Disposition: A | Discharge status: Home / Self Care | Payer: Self-pay | Attending: Emergency Medicine | Admitting: Emergency Medicine

## 2018-06-18 ENCOUNTER — Other Ambulatory Visit: Payer: Self-pay

## 2018-06-18 DIAGNOSIS — M79652 Pain in left thigh: Secondary | ICD-10-CM | POA: Insufficient documentation

## 2018-06-18 DIAGNOSIS — R791 Abnormal coagulation profile: Secondary | ICD-10-CM | POA: Insufficient documentation

## 2018-06-18 DIAGNOSIS — R509 Fever, unspecified: Secondary | ICD-10-CM

## 2018-06-18 DIAGNOSIS — Z79899 Other long term (current) drug therapy: Secondary | ICD-10-CM | POA: Insufficient documentation

## 2018-06-18 DIAGNOSIS — R61 Generalized hyperhidrosis: Secondary | ICD-10-CM | POA: Insufficient documentation

## 2018-06-18 DIAGNOSIS — R945 Abnormal results of liver function studies: Secondary | ICD-10-CM | POA: Insufficient documentation

## 2018-06-18 DIAGNOSIS — M791 Myalgia, unspecified site: Secondary | ICD-10-CM | POA: Insufficient documentation

## 2018-06-18 DIAGNOSIS — R0602 Shortness of breath: Secondary | ICD-10-CM

## 2018-06-18 DIAGNOSIS — I82402 Acute embolism and thrombosis of unspecified deep veins of left lower extremity: Secondary | ICD-10-CM | POA: Insufficient documentation

## 2018-06-18 DIAGNOSIS — M79662 Pain in left lower leg: Secondary | ICD-10-CM | POA: Insufficient documentation

## 2018-06-18 DIAGNOSIS — Z1159 Encounter for screening for other viral diseases: Secondary | ICD-10-CM | POA: Insufficient documentation

## 2018-06-18 DIAGNOSIS — R7989 Other specified abnormal findings of blood chemistry: Secondary | ICD-10-CM

## 2018-06-18 DIAGNOSIS — R51 Headache: Secondary | ICD-10-CM | POA: Insufficient documentation

## 2018-06-18 DIAGNOSIS — M79609 Pain in unspecified limb: Secondary | ICD-10-CM

## 2018-06-18 LAB — COMPREHENSIVE METABOLIC PANEL
ALT: 65 U/L — ABNORMAL HIGH (ref 0–44)
AST: 62 U/L — ABNORMAL HIGH (ref 15–41)
Albumin: 4.1 g/dL (ref 3.5–5.0)
Alkaline Phosphatase: 141 U/L — ABNORMAL HIGH (ref 38–126)
Anion gap: 17 — ABNORMAL HIGH (ref 5–15)
BUN: 17 mg/dL (ref 6–20)
CO2: 20 mmol/L — ABNORMAL LOW (ref 22–32)
Calcium: 9.3 mg/dL (ref 8.9–10.3)
Chloride: 98 mmol/L (ref 98–111)
Creatinine, Ser: 0.99 mg/dL (ref 0.61–1.24)
GFR calc Af Amer: >60 mL/min (ref >60)
GFR calc non Af Amer: >60 mL/min (ref >60)
Glucose, Bld: 128 mg/dL — ABNORMAL HIGH (ref 70–99)
Potassium: 3.7 mmol/L (ref 3.5–5.1)
Sodium: 135 mmol/L (ref 135–145)
Total Bilirubin: 1.1 mg/dL (ref 0.3–1.2)
Total Protein: 8.6 g/dL — ABNORMAL HIGH (ref 6.5–8.1)

## 2018-06-18 LAB — CBC WITH DIFFERENTIAL/PLATELET
Abs Immature Granulocytes: 0.03 10*3/uL (ref 0.00–0.07)
Basophils Absolute: 0 10*3/uL (ref 0.0–0.1)
Basophils Relative: 0 %
Eosinophils Absolute: 0.1 10*3/uL (ref 0.0–0.5)
Eosinophils Relative: 1 %
HCT: 44.2 % (ref 39.0–52.0)
Hemoglobin: 13.6 g/dL (ref 13.0–17.0)
Immature Granulocytes: 0 %
Lymphocytes Relative: 10 %
Lymphs Abs: 0.9 10*3/uL (ref 0.7–4.0)
MCH: 20.9 pg — ABNORMAL LOW (ref 26.0–34.0)
MCHC: 30.8 g/dL (ref 30.0–36.0)
MCV: 68 fL — ABNORMAL LOW (ref 80.0–100.0)
Monocytes Absolute: 1 10*3/uL (ref 0.1–1.0)
Monocytes Relative: 11 %
Neutro Abs: 7.2 10*3/uL (ref 1.7–7.7)
Neutrophils Relative %: 78 %
Platelets: 294 10*3/uL (ref 150–400)
RBC: 6.5 MIL/uL — ABNORMAL HIGH (ref 4.22–5.81)
RDW: 15.1 % (ref 11.5–15.5)
WBC: 9.3 10*3/uL (ref 4.0–10.5)
nRBC: 0 % (ref 0.0–0.2)

## 2018-06-18 LAB — URINALYSIS, ROUTINE W REFLEX MICROSCOPIC
Bacteria, UA: NONE SEEN
Bilirubin Urine: NEGATIVE
Glucose, UA: NEGATIVE mg/dL
Ketones, ur: 5 mg/dL — AB
Leukocytes,Ua: NEGATIVE
Nitrite: NEGATIVE
Protein, ur: NEGATIVE mg/dL
Specific Gravity, Urine: 1.028 (ref 1.005–1.030)
pH: 6 (ref 5.0–8.0)

## 2018-06-18 LAB — LACTIC ACID, PLASMA: Lactic Acid, Venous: 1.5 mmol/L (ref 0.5–1.9)

## 2018-06-18 LAB — SARS CORONAVIRUS 2 BY RT PCR (HOSPITAL ORDER, PERFORMED IN ~~LOC~~ HOSPITAL LAB): SARS Coronavirus 2: NEGATIVE

## 2018-06-18 LAB — CK: Total CK: 83 U/L (ref 49–397)

## 2018-06-18 LAB — D-DIMER, QUANTITATIVE: D-Dimer, Quant: 0.89 ug/mL-FEU — ABNORMAL HIGH (ref 0.00–0.50)

## 2018-06-18 MED ORDER — RIVAROXABAN (XARELTO) VTE STARTER PACK (15 & 20 MG): ORAL_TABLET | ORAL | 0 refills | Status: DC

## 2018-06-18 MED ORDER — RIVAROXABAN (XARELTO) EDUCATION KIT FOR DVT/PE PATIENTS
PACK | Freq: Once | Status: AC
  Administered 2018-06-18: 11:00:00
  Filled 2018-06-18: qty 1

## 2018-06-18 MED ORDER — KETOROLAC TROMETHAMINE 15 MG/ML IJ SOLN: 15.0000 mg | Freq: Once | INTRAMUSCULAR | Status: DC

## 2018-06-18 MED ORDER — ACETAMINOPHEN 500 MG PO TABS
1000.0000 mg | ORAL_TABLET | Freq: Once | ORAL | Status: AC
  Administered 2018-06-18: 1000 mg via ORAL
  Filled 2018-06-18: qty 2

## 2018-06-18 MED ORDER — KETOROLAC TROMETHAMINE 15 MG/ML IJ SOLN: 30.0000 mg | Freq: Once | INTRAMUSCULAR | Status: DC

## 2018-06-18 MED ORDER — SODIUM CHLORIDE 0.9 % IV BOLUS
1000.0000 mL | Freq: Once | INTRAVENOUS | Status: AC
  Administered 2018-06-18: 1000 mL via INTRAVENOUS

## 2018-06-18 MED ORDER — IOHEXOL 350 MG/ML SOLN
100.0000 mL | Freq: Once | INTRAVENOUS | Status: AC | PRN
  Administered 2018-06-18: 100 mL via INTRAVENOUS

## 2018-06-18 MED ORDER — RIVAROXABAN 15 MG PO TABS
15.0000 mg | ORAL_TABLET | Freq: Once | ORAL | Status: AC
  Administered 2018-06-18: 15 mg via ORAL
  Filled 2018-06-18 (×2): qty 1

## 2018-06-18 NOTE — ED Provider Notes (Signed)
[facility] EMERGENCY DEPARTMENT Provider Note   CSN: [id number] Arrival date & time: 06/18/18  0230    History   Chief Complaint Chief Complaint  Patient presents with  . Fever    HPI Danny Kim is a 48 y.o. male.     Patient presents to the emergency department with a chief complaint of fevers, chills, body aches, night sweats.  He reports having some pain in his left calf and thigh.  Denies any swelling or redness.  Denies any chest pain or shortness of breath.  Denies any cough.  Denies any known exposures to COVID-19.  Denies any treatments prior to arrival.  He states that he does have a headache, but denies any neck stiffness.  Denies any ear pain or sore throat.  The history is provided by the patient. No language interpreter was used.    Past Medical History:  Diagnosis Date  . Generalized headaches     There are no active problems to display for this patient.   History reviewed. No pertinent surgical history.      Home Medications    Prior to Admission medications   Medication Sig Start Date End Date Taking? Authorizing Provider  acetaminophen (TYLENOL) 500 MG tablet Take 500-1,000 mg by mouth every 6 (six) hours as needed (for pain or headaches).    [provider]  amoxicillin-clavulanate (AUGMENTIN) 875-125 MG tablet Take 1 tablet by mouth every 12 (twelve) hours. 01/01/17   Bethel Born, PA-C  colchicine 0.6 MG tablet Take 1 tablet (0.6 mg total) by mouth daily. 01/13/18   Ward, Layla Maw, DO  dicyclomine (BENTYL) 20 MG tablet Take 1 tablet (20 mg total) by mouth 2 (two) times daily. 12/23/16   Khatri, Hina, PA-C  HYDROcodone-acetaminophen (NORCO/VICODIN) 5-325 MG tablet Take 1 tablet by mouth every 6 (six) hours as needed for moderate pain. 01/12/18   Lawyer, Cristal Deer, PA-C  ibuprofen (ADVIL,MOTRIN) 200 MG tablet Take 400 mg by mouth daily as needed for mild pain.    [provider]  indomethacin (INDOCIN) 50 MG  capsule Take 1 capsule (50 mg total) by mouth 3 (three) times daily with meals. 01/13/18   Ward, Layla Maw, DO  omeprazole (PRILOSEC) 20 MG capsule Take 1 capsule (20 mg total) by mouth daily. Patient not taking: Reported on 12/23/2016 10/27/16   Renne Crigler, PA-C  predniSONE (DELTASONE) 50 MG tablet Take 1 tablet (50 mg total) by mouth daily with breakfast. 01/12/18   Lawyer, Cristal Deer, PA-C  Pseudoeph-Doxylamine-DM-APAP (NYQUIL PO) Take 2 tablets by mouth daily as needed.    [provider]  Pseudoephedrine-APAP-DM (DAYQUIL PO) Take 2 tablets by mouth daily as needed (cold symptoms).    [provider]    Family History No family history on file.  Social History Social History   Tobacco Use  . Smoking status: Never Smoker  . Smokeless tobacco: Never Used  Substance Use Topics  . Alcohol use: No  . Drug use: No     Allergies   Bee venom   Review of Systems Review of Systems  All other systems reviewed and are negative.    Physical Exam Updated Vital Signs BP (!) 141/93   Pulse 98   Temp (!) 100.6 F (38.1 C) (Oral)   Resp 18   SpO2 96%   Physical Exam Vitals signs and nursing note reviewed.  Constitutional:      Appearance: He is well-developed.  HENT:     Head: Normocephalic and atraumatic.  Eyes:     Conjunctiva/sclera: Conjunctivae normal.  Neck:     Musculoskeletal: Neck supple.  Cardiovascular:     Rate and Rhythm: Normal rate and regular rhythm.     Heart sounds: No murmur.  Pulmonary:     Effort: Pulmonary effort is normal. No respiratory distress.     Breath sounds: Normal breath sounds.     Comments: CTAB Abdominal:     Palpations: Abdomen is soft.     Tenderness: There is no abdominal tenderness.     Comments: No focal abdominal tenderness, no RLQ tenderness or pain at McBurney's point, no RUQ tenderness or Murphy's sign, no left-sided abdominal tenderness, no fluid wave, or signs of peritonitis   Musculoskeletal: Normal  range of motion.  Skin:    General: Skin is warm and dry.  Neurological:     Mental Status: He is alert and oriented to person, place, and time.  Psychiatric:        Mood and Affect: Mood normal.        Behavior: Behavior normal.        Thought Content: Thought content normal.        Judgment: Judgment normal.      ED Treatments / Results  Labs (all labs ordered are listed, but only abnormal results are displayed) Labs Reviewed  CBC WITH DIFFERENTIAL/PLATELET - Abnormal; Notable for the following components:      Result Value   RBC 6.50 (*)    MCV 68.0 (*)    MCH 20.9 (*)    All other components within normal limits  COMPREHENSIVE METABOLIC PANEL - Abnormal; Notable for the following components:   CO2 20 (*)    Glucose, Bld 128 (*)    Total Protein 8.6 (*)    AST 62 (*)    ALT 65 (*)    Alkaline Phosphatase 141 (*)    Anion gap 17 (*)    All other components within normal limits  SARS CORONAVIRUS 2 (HOSPITAL ORDER, PERFORMED IN [place] [facility])  CULTURE, BLOOD (ROUTINE X 2)  CULTURE, BLOOD (ROUTINE X 2)  LACTIC ACID, PLASMA  URINALYSIS, ROUTINE W REFLEX MICROSCOPIC  LACTIC ACID, PLASMA  D-DIMER, QUANTITATIVE (NOT AT [facility])  CK    EKG None  Radiology Dg Chest Portable 1 View  Result Date: 06/18/2018 CLINICAL DATA:  48 year old male with history of night sweats, fevers, body aches and chills for the past several weeks. EXAM: PORTABLE CHEST 1 VIEW COMPARISON:  Chest x-ray 08/23/2014. FINDINGS: Lung volumes are normal. No consolidative airspace disease. No pleural effusions. No pneumothorax. No pulmonary nodule or mass noted. Pulmonary vasculature and the cardiomediastinal silhouette are within normal limits. IMPRESSION: No radiographic evidence of acute cardiopulmonary disease. Electronically Signed   By: Trudie Reedaniel  Entrikin M.D.   On: 06/18/2018 04:27    Procedures Procedures (including critical care time)  Medications Ordered in ED Medications  sodium  chloride 0.9 % bolus 1,000 mL (1,000 mLs Intravenous New Bag/Given 06/18/18 0456)  acetaminophen (TYLENOL) tablet 1,000 mg (1,000 mg Oral Given 06/18/18 0456)     Initial Impression / Assessment and Plan / ED Course  I have reviewed the triage vital signs and the nursing notes.  Pertinent labs & imaging results that were available during my care of the patient were reviewed by me and considered in my medical decision making (see chart for details).        Patient with low-grade fever, myalgias, left leg pain ongoing for several weeks.  Coronavirus  test negative.  Chest x-ray is negative.  No leukocytosis.  D-dimer is slightly elevated, CT PE study pending.  Would likely benefit from extremity ultrasound to rule out DVT if PE study is negative.  Currently nontoxic-appearing.  Appreciate [place], PA-C, for continuing care.    Final Clinical Impressions(s) / ED Diagnoses   Final diagnoses:  None    ED Discharge Orders    None       Roxy Horseman, PA-C 06/18/18 1610    Glynn Octave, MD 06/19/18 [phone]

## 2018-06-18 NOTE — Care Management (Signed)
ED CM contacted pharmacy regarding 30 day free trial card.  Xarelto starter packet with 30 day free card sent to ED from [employer].  ED CM went down to Main Pharmacy to pick the trail card up. CM provided patient with card and information to follow up at the [facility], Patient verbalized understanding teach back done. Patient consented to have information forwarded to the CM at the clinic.

## 2018-06-18 NOTE — Progress Notes (Signed)
CSW received call from ED staff to arrange primary care appointment for follow up with the patient. CSW informed them it would not be able to occur until Monday but will provide patient resources and leave behind a note for a weekday follow-up regarding indigent healthcare appointment.   Tenna Delaine, LCSW, LCAS-A Clinical Social Worker II [phone]

## 2018-06-18 NOTE — ED Triage Notes (Signed)
Pt states that for the past several weeks he has been having night sweats, fevers, body aches,and chills.  Pt reports that he has had headache and nausea.

## 2018-06-18 NOTE — ED Notes (Signed)
Pts wife has been called and updated on pts diagnoses and needed outpatient care.

## 2018-06-18 NOTE — Discharge Instructions (Addendum)
Information on my medicine - XARELTO (rivaroxaban)  This medication education was reviewed with me or my healthcare representative as part of my discharge preparation.    WHY WAS XARELTO PRESCRIBED FOR YOU? Xarelto was prescribed to treat blood clots that may have been found in the veins of your legs (deep vein thrombosis) or in your lungs (pulmonary embolism) and to reduce the risk of them occurring again.  What do you need to know about Xarelto? The starting dose is one 15 mg tablet taken TWICE daily with food for the FIRST 21 DAYS then on June 14th, 2020 the dose is changed to one 20 mg tablet taken ONCE A DAY with your evening meal.  DO NOT stop taking Xarelto without talking to the health care provider who prescribed the medication.  Refill your prescription for 20 mg tablets before you run out.  After discharge, you should have regular check-up appointments with your healthcare provider that is prescribing your Xarelto.  In the future your dose may need to be changed if your kidney function changes by a significant amount.  What do you do if you miss a dose? If you are taking Xarelto TWICE DAILY and you miss a dose, take it as soon as you remember. You may take two 15 mg tablets (total 30 mg) at the same time then resume your regularly scheduled 15 mg twice daily the next day.  If you are taking Xarelto ONCE DAILY and you miss a dose, take it as soon as you remember on the same day then continue your regularly scheduled once daily regimen the next day. Do not take two doses of Xarelto at the same time.   Important Safety Information Xarelto is a blood thinner medicine that can cause bleeding. You should call your healthcare provider right away if you experience any of the following: ? Bleeding from an injury or your nose that does not stop. ? Unusual colored urine (red or dark brown) or unusual colored stools (red or black). ? Unusual bruising for unknown reasons. ? A serious  fall or if you hit your head (even if there is no bleeding).  Some medicines may interact with Xarelto and might increase your risk of bleeding while on Xarelto. To help avoid this, consult your healthcare provider or pharmacist prior to using any new prescription or non-prescription medications, including herbals, vitamins, non-steroidal anti-inflammatory drugs (NSAIDs) and supplements.  This website has more information on Xarelto: [email or web address].br.

## 2018-06-18 NOTE — Progress Notes (Signed)
VASCULAR LAB PRELIMINARY  PRELIMINARY  PRELIMINARY  PRELIMINARY  Left lower extremity venous duplex completed.    Preliminary report:  See CV proc for preliminary results.  Gave report to [facility], PA-C  Kirubel Aja, RVT 06/18/2018, 8:56 AM

## 2018-06-18 NOTE — Progress Notes (Signed)
ANTICOAGULATION CONSULT NOTE - Initial Consult  Pharmacy Consult for Xarelto Indication: New LLE DVT  Allergies  Allergen Reactions  . Bee Venom Anaphylaxis    Patient Measurements:   Heparin Dosing Weight:   Vital Signs: Temp: 100.6 F (38.1 C) (05/24 0248) Temp Source: Oral (05/24 0248) BP: 130/77 (05/24 0600) Pulse Rate: 77 (05/24 0830)  Labs: Recent Labs    06/18/18 0343 06/18/18 0447  HGB 13.6  --   HCT 44.2  --   PLT 294  --   CREATININE 0.99  --   CKTOTAL  --  83    CrCl cannot be calculated (Unknown ideal weight.).   Medical History: Past Medical History:  Diagnosis Date  . Generalized headaches     Assessment: 30 YOM who presented to the [facility] on 5/24 with fevers, body aches, and HA with L-calf pain and was found to have a LLE DVT. Pharmacy consulted for Xarelto dosing.    Goal of Therapy:  Appropriate anticoagulation for indication and hepatic/renal function   Plan:  - Xarelto 15 mg bid with meals for 21 days followed by 20 mg once daily with supper (starting on 6/14) - Educational sheet placed in discharge paperwork and education provided to wife over phone  Thank you for allowing pharmacy to be a part of this patient's care.  Georgina Pillion, PharmD, BCPS Clinical Pharmacist Clinical phone for 06/18/2018: [phone] 06/18/2018 9:41 AM   **Pharmacist phone directory can now be found on [email or web address] ([email or web address]).  Listed under [facility].

## 2018-06-18 NOTE — ED Notes (Signed)
Patient verbalizes understanding of discharge instructions. Opportunity for questioning and answers were provided. Armband removed by staff, pt discharged from ED.  

## 2018-06-18 NOTE — ED Provider Notes (Signed)
  Physical Exam  BP 130/77   Pulse 91   Temp (!) 100.6 F (38.1 C) (Oral)   Resp 18   SpO2 97%   Physical Exam   Gen: appears nontoxic  ED Course/Procedures     Procedures  MDM   Patient signed out to me by Ree Shay, PA-C.  Please see previous notes for further history.  In brief, patient presenting for evaluation of fevers, chills, body aches, night sweats.  Additionally, having pain in his left calf and thigh.  No shortness of breath or chest pain.  No cough.  Initial concern for COVID versus TB.  Dimer mildly elevated, PE study pending.  Labs show mild increase in liver enzymes.  Otherwise reassuring.  covid negative.  ED course complicated in that patient speaks [place], and no IT trainer was able to be found overnight.  Initial history was obtained with a family member who spoke some [place].  CTA negative for PE.  Ultrasound ordered for leg pain and elevated dimer.  Consider disseminated gonococcal illness, although less likely with a normal white count. Will order Gc/Cl. As patients with enzymes are elevated, will order hepatitis panel.  Ultrasound of the leg positive for DVT.  I discussed with patient, who seemed to indicate understanding despite use of [place].  No code interpreter could be obtained.  I stressed the importance of follow-up with a primary care doctor, information given for primary care and message sent to case management.  Patient has multiple labs pending, including hepatitis, cultures, gonorrhea chlamydia.  If positive, he will need to be followed up with either primary care or in the ED.  At this time, patient appears safe for discharge.  Return precautions given.  Patient states he understands agrees plan.         Alveria Apley, PA-C 06/18/18 1013    Margarita Grizzle, MD 06/22/18 [phone]

## 2018-06-18 NOTE — ED Notes (Signed)
Pharmacy has been called to send down Xarelto education kit for patient. Pt has been educated on Xarelto by ED pharmacist.

## 2018-06-18 NOTE — ED Notes (Signed)
wifes number is [phone]

## 2018-06-18 NOTE — ED Notes (Signed)
Patient transported to CT 

## 2018-06-20 LAB — HEPATITIS PANEL, ACUTE
HCV Ab: 0.3 s/co ratio (ref 0.0–0.9)
Hep A IgM: NEGATIVE
Hep B C IgM: NEGATIVE
Hepatitis B Surface Ag: NEGATIVE

## 2018-06-20 LAB — GC/CHLAMYDIA PROBE AMP (~~LOC~~) NOT AT ARMC
Chlamydia: NEGATIVE
Neisseria Gonorrhea: NEGATIVE

## 2018-06-22 ENCOUNTER — Telehealth: Payer: Self-pay | Admitting: General Practice

## 2018-06-22 NOTE — Telephone Encounter (Signed)
-----   Message from Robyne Peers, RN sent at 06/21/2018  4:23 PM EDT ----- Regarding: FW: ED f/u started on Xarelto  ----- Message ----- From: Michel Bickers, RN Sent: 06/20/2018   7:02 PM EDT To: Robyne Peers, RN Subject: RE: ED f/u started on Xarelto                  I am not sure, but I do know his wife speaks [place].  Thanks Erskine Squibb     ----- Message ----- From: Robyne Peers, RN Sent: 06/20/2018  10:17 AM EDT To: Michel Bickers, RN Subject: RE: ED f/u started on Xarelto                  Do you know if this patient is connected with [facility]?  They have a [place] interpreter. ----- Message ----- From: Michel Bickers, RN Sent: 06/18/2018   3:35 PM EDT To: Robyne Peers, RN Subject: ED f/u started on Xarelto                      Hi Jane   I hope all is well, I need your help with this patient. He was seen in the ED with r/o DVT and started on Xarelto.  We supplied him the 30 day free trial assistance, but  wanted him to get connected with the clinic so he can be managed for his health needs. Thanks Erskine Squibb.    Sherwood Gambler

## 2018-06-22 NOTE — Telephone Encounter (Signed)
Message sent to Dorice Lamas, RN CM informing her that as per patient's wife, he has an appointment somewhere else.

## 2018-06-22 NOTE — Telephone Encounter (Signed)
Called the patient spoke to his wife she stated he has an appointment somewhere else

## 2018-06-23 LAB — CULTURE, BLOOD (ROUTINE X 2)
Culture: NO GROWTH
Culture: NO GROWTH
Special Requests: ADEQUATE
Special Requests: ADEQUATE

## 2018-06-29 ENCOUNTER — Other Ambulatory Visit: Payer: Self-pay

## 2018-06-29 ENCOUNTER — Emergency Department (HOSPITAL_COMMUNITY)
Admission: EM | Admit: 2018-06-29 | Discharge: 2018-06-29 | Disposition: A | Discharge status: Home / Self Care | Payer: Self-pay | Attending: Emergency Medicine | Admitting: Emergency Medicine

## 2018-06-29 ENCOUNTER — Emergency Department (HOSPITAL_BASED_OUTPATIENT_CLINIC_OR_DEPARTMENT_OTHER): Payer: Self-pay

## 2018-06-29 ENCOUNTER — Encounter (HOSPITAL_COMMUNITY): Payer: Self-pay | Admitting: *Deleted

## 2018-06-29 ENCOUNTER — Emergency Department (HOSPITAL_COMMUNITY): Payer: Self-pay

## 2018-06-29 DIAGNOSIS — R7989 Other specified abnormal findings of blood chemistry: Secondary | ICD-10-CM

## 2018-06-29 DIAGNOSIS — R0789 Other chest pain: Secondary | ICD-10-CM | POA: Insufficient documentation

## 2018-06-29 DIAGNOSIS — Z20828 Contact with and (suspected) exposure to other viral communicable diseases: Secondary | ICD-10-CM | POA: Insufficient documentation

## 2018-06-29 DIAGNOSIS — M79609 Pain in unspecified limb: Secondary | ICD-10-CM

## 2018-06-29 DIAGNOSIS — Z86718 Personal history of other venous thrombosis and embolism: Secondary | ICD-10-CM | POA: Insufficient documentation

## 2018-06-29 DIAGNOSIS — Z79899 Other long term (current) drug therapy: Secondary | ICD-10-CM | POA: Insufficient documentation

## 2018-06-29 DIAGNOSIS — R945 Abnormal results of liver function studies: Secondary | ICD-10-CM | POA: Insufficient documentation

## 2018-06-29 DIAGNOSIS — M109 Gout, unspecified: Secondary | ICD-10-CM | POA: Insufficient documentation

## 2018-06-29 HISTORY — DX: Gout, unspecified: M10.9

## 2018-06-29 HISTORY — DX: Acute embolism and thrombosis of unspecified deep veins of unspecified lower extremity: I82.409

## 2018-06-29 LAB — COMPREHENSIVE METABOLIC PANEL
ALT: 101 U/L — ABNORMAL HIGH (ref 0–44)
AST: 67 U/L — ABNORMAL HIGH (ref 15–41)
Albumin: 3.7 g/dL (ref 3.5–5.0)
Alkaline Phosphatase: 213 U/L — ABNORMAL HIGH (ref 38–126)
Anion gap: 13 (ref 5–15)
BUN: 12 mg/dL (ref 6–20)
CO2: 22 mmol/L (ref 22–32)
Calcium: 9.2 mg/dL (ref 8.9–10.3)
Chloride: 98 mmol/L (ref 98–111)
Creatinine, Ser: 0.95 mg/dL (ref 0.61–1.24)
GFR calc Af Amer: >60 mL/min (ref >60)
GFR calc non Af Amer: >60 mL/min (ref >60)
Glucose, Bld: 104 mg/dL — ABNORMAL HIGH (ref 70–99)
Potassium: 4.3 mmol/L (ref 3.5–5.1)
Sodium: 133 mmol/L — ABNORMAL LOW (ref 135–145)
Total Bilirubin: 0.8 mg/dL (ref 0.3–1.2)
Total Protein: 8.8 g/dL — ABNORMAL HIGH (ref 6.5–8.1)

## 2018-06-29 LAB — SARS CORONAVIRUS 2 BY RT PCR (HOSPITAL ORDER, PERFORMED IN ~~LOC~~ HOSPITAL LAB): SARS Coronavirus 2: NEGATIVE

## 2018-06-29 LAB — CBC
HCT: 44.4 % (ref 39.0–52.0)
Hemoglobin: 13.3 g/dL (ref 13.0–17.0)
MCH: 20.4 pg — ABNORMAL LOW (ref 26.0–34.0)
MCHC: 30 g/dL (ref 30.0–36.0)
MCV: 68 fL — ABNORMAL LOW (ref 80.0–100.0)
Platelets: 319 10*3/uL (ref 150–400)
RBC: 6.53 MIL/uL — ABNORMAL HIGH (ref 4.22–5.81)
RDW: 14.5 % (ref 11.5–15.5)
WBC: 9.4 10*3/uL (ref 4.0–10.5)
nRBC: 0 % (ref 0.0–0.2)

## 2018-06-29 LAB — LACTIC ACID, PLASMA: Lactic Acid, Venous: 1.6 mmol/L (ref 0.5–1.9)

## 2018-06-29 LAB — URIC ACID: Uric Acid, Serum: 6 mg/dL (ref 3.7–8.6)

## 2018-06-29 LAB — TROPONIN I: Troponin I: <0.03 ng/mL (ref <0.03)

## 2018-06-29 MED ORDER — SODIUM CHLORIDE 0.9% FLUSH: 3.0000 mL | Freq: Once | INTRAVENOUS | Status: DC

## 2018-06-29 MED ORDER — COLCHICINE 0.6 MG PO TABS: 0.6000 mg | ORAL_TABLET | Freq: Once | ORAL | Status: DC

## 2018-06-29 MED ORDER — ACETAMINOPHEN 500 MG PO TABS
1000.0000 mg | ORAL_TABLET | Freq: Once | ORAL | Status: AC
  Administered 2018-06-29: 1000 mg via ORAL
  Filled 2018-06-29: qty 2

## 2018-06-29 MED ORDER — SODIUM CHLORIDE 0.9 % IV BOLUS
1000.0000 mL | Freq: Once | INTRAVENOUS | Status: AC
  Administered 2018-06-29: 1000 mL via INTRAVENOUS

## 2018-06-29 MED ORDER — OXYCODONE-ACETAMINOPHEN 5-325 MG PO TABS: 1.0000 | ORAL_TABLET | Freq: Four times a day (QID) | ORAL | 0 refills | Status: DC | PRN

## 2018-06-29 MED ORDER — COLCHICINE 0.6 MG PO TABS: 0.6000 mg | ORAL_TABLET | Freq: Two times a day (BID) | ORAL | 0 refills | Status: DC

## 2018-06-29 MED ORDER — MORPHINE SULFATE (PF) 4 MG/ML IV SOLN
4.0000 mg | Freq: Once | INTRAVENOUS | Status: AC
  Administered 2018-06-29: 4 mg via INTRAVENOUS
  Filled 2018-06-29: qty 1

## 2018-06-29 NOTE — Progress Notes (Signed)
Left lower extremity venous duplex has been completed. Preliminary results can be found in CV Proc through chart review.  Results were given to Dr. Silverio Lay.  06/29/18 6:59 PM Olen Cordial RVT

## 2018-06-29 NOTE — ED Provider Notes (Signed)
[facility] EMERGENCY DEPARTMENT Provider Note   CSN: [id number] Arrival date & time: 06/29/18  1718    History   Chief Complaint Chief Complaint  Patient presents with   Foot Pain   Chest Pain    HPI Zae Kirtz is a 48 y.o. male hx of DVT on xarelto, gout, here presenting with left foot pain, fever.  Patient states that he was recently diagnosed with left leg DVT.  He is taking his Xarelto starter pack as prescribed.  Patient states that for the last 2 days or so, he has been having left foot big toe pain.  He states that he also running a fever as well.      The history is provided by the patient.    Past Medical History:  Diagnosis Date   DVT (deep venous thrombosis) (HCC)    Generalized headaches    Gout     There are no active problems to display for this patient.   History reviewed. No pertinent surgical history.      Home Medications    Prior to Admission medications   Medication Sig Start Date End Date Taking? Authorizing Provider  acetaminophen (TYLENOL) 500 MG tablet Take 500-1,000 mg by mouth every 6 (six) hours as needed (for pain or headaches).    [provider]  amoxicillin-clavulanate (AUGMENTIN) 875-125 MG tablet Take 1 tablet by mouth every 12 (twelve) hours. 01/01/17   Bethel Born, PA-C  colchicine 0.6 MG tablet Take 1 tablet (0.6 mg total) by mouth daily. 01/13/18   Ward, Layla Maw, DO  dicyclomine (BENTYL) 20 MG tablet Take 1 tablet (20 mg total) by mouth 2 (two) times daily. 12/23/16   Khatri, Hina, PA-C  HYDROcodone-acetaminophen (NORCO/VICODIN) 5-325 MG tablet Take 1 tablet by mouth every 6 (six) hours as needed for moderate pain. 01/12/18   Lawyer, Cristal Deer, PA-C  ibuprofen (ADVIL,MOTRIN) 200 MG tablet Take 400 mg by mouth daily as needed for mild pain.    [provider]  indomethacin (INDOCIN) 50 MG capsule Take 1 capsule (50 mg total) by mouth 3 (three) times daily with meals. 01/13/18   Ward,  Layla Maw, DO  omeprazole (PRILOSEC) 20 MG capsule Take 1 capsule (20 mg total) by mouth daily. Patient not taking: Reported on 12/23/2016 10/27/16   Renne Crigler, PA-C  predniSONE (DELTASONE) 50 MG tablet Take 1 tablet (50 mg total) by mouth daily with breakfast. 01/12/18   Lawyer, Cristal Deer, PA-C  Pseudoeph-Doxylamine-DM-APAP (NYQUIL PO) Take 2 tablets by mouth daily as needed.    [provider]  Pseudoephedrine-APAP-DM (DAYQUIL PO) Take 2 tablets by mouth daily as needed (cold symptoms).    [provider]  Rivaroxaban 15 & 20 MG TBPK Take as directed on package: Start with one  tablet by mouth twice a day with food. On Day 22, switch to one  tablet once a day with food. 06/18/18   Caccavale, Sophia, PA-C    Family History No family history on file.  Social History Social History   Tobacco Use   Smoking status: Never Smoker   Smokeless tobacco: Never Used  Substance Use Topics   Alcohol use: No   Drug use: No     Allergies   Bee venom   Review of Systems Review of Systems  Cardiovascular: Positive for chest pain.  Musculoskeletal:       L foot pain   All other systems reviewed and are negative.    Physical Exam Updated Vital Signs  BP 128/89    Pulse 87    Temp (!) 101.7 F (38.7 C) (Oral)    Resp (!) 27    SpO2 98%   Physical Exam Vitals signs and nursing note reviewed.  HENT:     Head: Normocephalic.  Eyes:     Pupils: Pupils are equal, round, and reactive to light.  Neck:     Musculoskeletal: Normal range of motion and neck supple.  Cardiovascular:     Rate and Rhythm: Normal rate and regular rhythm.     Heart sounds: Normal heart sounds.  Pulmonary:     Effort: Pulmonary effort is normal.  Abdominal:     General: Bowel sounds are normal.     Palpations: Abdomen is soft.  Musculoskeletal:     Comments: L big toe swollen and tender consistent with gout of L big toe, no obvious septic joint. L calf slightly swollen     Skin:    General: Skin is warm.     Capillary Refill: Capillary refill takes less than 2 seconds.  Neurological:     General: No focal deficit present.     Mental Status: He is alert and oriented to person, place, and time.  Psychiatric:        Mood and Affect: Mood normal.        Behavior: Behavior normal.      ED Treatments / Results  Labs (all labs ordered are listed, but only abnormal results are displayed) Labs Reviewed  CBC - Abnormal; Notable for the following components:      Result Value   RBC 6.53 (*)    MCV 68.0 (*)    MCH 20.4 (*)    All other components within normal limits  CULTURE, BLOOD (ROUTINE X 2)  CULTURE, BLOOD (ROUTINE X 2)  SARS CORONAVIRUS 2 (HOSPITAL ORDER, PERFORMED IN [facility])  TROPONIN I  LACTIC ACID, PLASMA  LACTIC ACID, PLASMA  COMPREHENSIVE METABOLIC PANEL  URIC ACID    EKG EKG Interpretation  Date/Time:  Thursday June 29 2018 18:09:05 EDT Ventricular Rate:  89 PR Interval:    QRS Duration: 95 QT Interval:  387 QTC Calculation: 471 R Axis:   37 Text Interpretation:  Sinus rhythm Minimal ST elevation, anterior leads No significant change since last tracing Confirmed by Richardean CanalYao, Emersen Mascari [id number]) on 06/29/2018 6:51:03 PM   Radiology Dg Chest 2 View  Result Date: 06/29/2018 CLINICAL DATA:  Medial left foot pain since last night. History of gout. EXAM: CHEST - 2 VIEW COMPARISON:  Chest CTA and portable chest radiograph dated 06/18/2018. FINDINGS: Normal sized heart. Clear lungs. Minimal scoliosis. Mild thoracic spine degenerative changes. IMPRESSION: No acute abnormality. Electronically Signed   By: Beckie SaltsSteven  Reid M.D.   On: 06/29/2018 20:11   Vas [place] Lower Extremity Venous (dvt) (only [facility])  Result Date: 06/29/2018  Lower Venous Study Indications: Pain, and History of DVT.  Comparison Study: 06/18/2018 - Left PeroV Performing Technologist: Chanda BusingGregory Collins RVT  Examination Guidelines: A complete evaluation includes B-mode  imaging, spectral Doppler, color Doppler, and power Doppler as needed of all accessible portions of each vessel. Bilateral testing is considered an integral part of a complete examination. Limited examinations for reoccurring indications may be performed as noted.  +-----+---------------+---------+-----------+----------+-------+  RIGHT Compressibility Phasicity Spontaneity Properties Summary  +-----+---------------+---------+-----------+----------+-------+  CFV   Full            Yes       Yes                             +-----+---------------+---------+-----------+----------+-------+   +---------+---------------+---------+-----------+----------+-------+  LEFT      Compressibility Phasicity Spontaneity Properties Summary  +---------+---------------+---------+-----------+----------+-------+  CFV       Full            Yes       Yes                             +---------+---------------+---------+-----------+----------+-------+  SFJ       Full                                                      +---------+---------------+---------+-----------+----------+-------+  FV Prox   Full                                                      +---------+---------------+---------+-----------+----------+-------+  FV Mid    Full                                                      +---------+---------------+---------+-----------+----------+-------+  FV Distal Full                                                      +---------+---------------+---------+-----------+----------+-------+  PFV       Full                                                      +---------+---------------+---------+-----------+----------+-------+  POP       Full            Yes       Yes                             +---------+---------------+---------+-----------+----------+-------+  PTV       Full                                                      +---------+---------------+---------+-----------+----------+-------+  PERO      Full                                                       +---------+---------------+---------+-----------+----------+-------+     Summary: Right: No evidence of common femoral vein obstruction. Left: There is no evidence of deep vein thrombosis in the lower extremity. No cystic structure found in the popliteal fossa.  *See table(s) above for measurements and observations.  Preliminary     Procedures Procedures (including critical care time)  Medications Ordered in ED Medications  sodium chloride flush (NS) 0.9 % injection 3 mL (has no administration in time range)  acetaminophen (TYLENOL) tablet 1,000 mg (1,000 mg Oral Given 06/29/18 2014)  sodium chloride 0.9 % bolus 1,000 mL (1,000 mLs Intravenous New Bag/Given 06/29/18 1920)     Initial Impression / Assessment and Plan / ED Course  I have reviewed the triage vital signs and the nursing notes.  Pertinent labs & imaging results that were available during my care of the patient were reviewed by me and considered in my medical decision making (see chart for details).       Elworth Laubenstein is a 48 y.o. male here with L foot pain, fever.  Patient is febrile in the ED and had a recent DVT on the left leg.  We will repeat a DVT study.  We will also do a sepsis work-up.  But I think his fever is likely secondary to his gout so we will get a uric acid level.  10:20 PM Felt better now. US showed that DVT has resolved. Patient's uric acid is 6. LFTs slightly elevated but abdominal pain or vomiting or tenderness.  Chest x-ray and foot x-ray unremarkable.  Pain controlled in the ED.  Will discharge home with some colchicine and pain medicines.  His COVID is negative.  Final Clinical Impressions(s) / ED Diagnoses   Final diagnoses:  None    ED Discharge Orders    None       Charlynne Pander, MD 06/29/18 2221

## 2018-06-29 NOTE — Discharge Instructions (Signed)
Follow up with your doctor  Take colchicine for gout   Take oxycodone for severe pain.   Your prescriptions were sent to your pharmacy   See your doctor  Return to ER if you have fever, vomiting, worse leg pain, shortness of breath

## 2018-06-29 NOTE — ED Triage Notes (Signed)
Pt reports L medial foot pain since last night.  He has hx of gout.  He is currently being tx for L DVT.  He also endorses cp but denies any sob.

## 2018-07-04 LAB — CULTURE, BLOOD (ROUTINE X 2)
Culture: NO GROWTH
Special Requests: ADEQUATE

## 2018-08-03 ENCOUNTER — Encounter (HOSPITAL_COMMUNITY): Payer: Self-pay | Admitting: Emergency Medicine

## 2018-08-03 ENCOUNTER — Ambulatory Visit (HOSPITAL_COMMUNITY)
Admission: EM | Admit: 2018-08-03 | Discharge: 2018-08-03 | Disposition: A | Discharge status: Home / Self Care | Payer: Self-pay

## 2018-08-03 DIAGNOSIS — M109 Gout, unspecified: Secondary | ICD-10-CM

## 2018-08-03 DIAGNOSIS — M79672 Pain in left foot: Secondary | ICD-10-CM

## 2018-08-03 MED ORDER — AMLODIPINE BESYLATE 2.5 MG PO TABS: 2.5000 mg | ORAL_TABLET | Freq: Every day | ORAL | 0 refills | Status: DC

## 2018-08-03 MED ORDER — PREDNISONE 10 MG (21) PO TBPK: ORAL_TABLET | ORAL | 0 refills | Status: DC

## 2018-08-03 MED ORDER — COLCHICINE 0.6 MG PO TABS: 0.6000 mg | ORAL_TABLET | Freq: Two times a day (BID) | ORAL | 0 refills | Status: DC

## 2018-08-03 NOTE — Discharge Instructions (Signed)
Treating you for gout. Refilling your blood pressure medicine No need to refill the Xarelto at this time You need to follow-up with primary care provider for continuous management of your medical issues. Putting a contact on your discharge instructions

## 2018-08-03 NOTE — ED Triage Notes (Signed)
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 Pt also states hx of gout.

## 2018-08-07 NOTE — ED Provider Notes (Signed)
[place]    CSN: [id number] Arrival date & time: 08/03/18  1313      History   Chief Complaint Chief Complaint  Patient presents with  . Foot Pain    HPI Richy Spradley is a 48 y.o. male.   Patient is a 48 year old male past medical history of hypertension, DVT, gout.  He presents today with bilateral foot pain for last few weeks.  Symptoms have been constant, waxing and waning.  Reporting some pain and swelling.  He has been taking colchicine without much relief.  He is requesting refill.  Denies any injuries to the feet, fevers.  ROS per HPI      Past Medical History:  Diagnosis Date  . DVT (deep venous thrombosis) ([place])   . Generalized headaches   . Gout     There are no active problems to display for this patient.   History reviewed. No pertinent surgical history.     Home Medications    Prior to Admission medications   Medication Sig Start Date End Date Taking? Authorizing Provider  acetaminophen (TYLENOL) 500 MG tablet Take 500-1,000 mg by mouth every 6 (six) hours as needed (for pain or headaches).    [provider]  amLODipine (NORVASC) 2.5 MG tablet Take 1 tablet (2.5 mg total) by mouth daily. 08/03/18   Loura Halt A, NP  colchicine 0.6 MG tablet Take 1 tablet (0.6 mg total) by mouth 2 (two) times daily. 08/03/18   Loura Halt A, NP  ibuprofen (ADVIL,MOTRIN) 200 MG tablet Take 400 mg by mouth daily as needed for mild pain.    [provider]  omeprazole (PRILOSEC) 20 MG capsule Take 1 capsule (20 mg total) by mouth daily. Patient not taking: Reported on 12/23/2016 10/27/16   Carlisle Cater, PA-C  predniSONE (STERAPRED UNI-PAK 21 TAB) 10 MG (21) TBPK tablet 6 tabs for 1 day, then 5 tabs for 1 das, then 4 tabs for 1 day, then 3 tabs for 1 day, 2 tabs for 1 day, then 1 tab for 1 day 08/03/18   Loura Halt A, NP  Pseudoeph-Doxylamine-DM-APAP (NYQUIL PO) Take 2 tablets by mouth daily as needed.    [provider]   Pseudoephedrine-APAP-DM (DAYQUIL PO) Take 2 tablets by mouth daily as needed (cold symptoms).    [provider]  Rivaroxaban 15 & 20 MG TBPK Take as directed on package: Start with one 15mg  tablet by mouth twice a day with food. On Day 22, switch to one 20mg  tablet once a day with food. 06/18/18   Caccavale, Sophia, PA-C  dicyclomine (BENTYL) 20 MG tablet Take 1 tablet (20 mg total) by mouth 2 (two) times daily. 12/23/16 08/03/18  Delia Heady, PA-C    Family History No family history on file.  Social History Social History   Tobacco Use  . Smoking status: Never Smoker  . Smokeless tobacco: Never Used  Substance Use Topics  . Alcohol use: No  . Drug use: No     Allergies   Bee venom   Review of Systems Review of Systems   Physical Exam Triage Vital Signs ED Triage Vitals  Enc Vitals Group     BP 08/03/18 1421 (!) 153/95     Pulse Rate 08/03/18 1421 90     Resp 08/03/18 1421 16     Temp 08/03/18 1421 98.5 F (36.9 C)     Temp src --      SpO2 08/03/18 1421 100 %  Weight --      Height --      Head Circumference --      Peak Flow --      Pain Score 08/03/18 1422 9     Pain Loc --      Pain Edu? --      Excl. in GC? --    No data found.  Updated Vital Signs BP (!) 153/95   Pulse 90   Temp 98.5 F (36.9 C)   Resp 16   SpO2 100%   Visual Acuity Right Eye Distance:   Left Eye Distance:   Bilateral Distance:    Right Eye Near:   Left Eye Near:    Bilateral Near:     Physical Exam Vitals signs and nursing note reviewed.  Constitutional:      Appearance: Normal appearance.  HENT:     Head: Normocephalic and atraumatic.     Nose: Nose normal.  Eyes:     Conjunctiva/sclera: Conjunctivae normal.  Neck:     Musculoskeletal: Normal range of motion.  Pulmonary:     Effort: Pulmonary effort is normal.  Musculoskeletal: Normal range of motion.        General: Swelling and tenderness present.  Skin:    General: Skin is warm and dry.      Findings: Erythema present.  Neurological:     Mental Status: He is alert.  Psychiatric:        Mood and Affect: Mood normal.      UC Treatments / Results  Labs (all labs ordered are listed, but only abnormal results are displayed) Labs Reviewed - No data to display  EKG   Radiology No results found.  Procedures Procedures (including critical care time)  Medications Ordered in UC Medications - No data to display  Initial Impression / Assessment and Plan / UC Course  I have reviewed the triage vital signs and the nursing notes.  Pertinent labs & imaging results that were available during my care of the patient were reviewed by me and considered in my medical decision making (see chart for details).     Symptoms consistent with gout.  Treating for gout flare with prednisone.  Refilled colchicine and high blood pressure medication.  Recommended to not take the colchicine until done with the prednisone. Follow up as needed for continued or worsening symptoms Contact for primary care provider put on patient's discharge instructions.  Final Clinical Impressions(s) / UC Diagnoses   Final diagnoses:  Foot pain, left  Acute gout of left foot, unspecified cause     Discharge Instructions     Treating you for gout. Refilling your blood pressure medicine No need to refill the Xarelto at this time You need to follow-up with primary care provider for continuous management of your medical issues. Putting a contact on your discharge instructions   ED Prescriptions    Medication Sig Dispense Auth. Provider   amLODipine (NORVASC) 2.5 MG tablet Take 1 tablet (2.5 mg total) by mouth daily. 30 tablet Cortland Crehan A, NP   predniSONE (STERAPRED UNI-PAK 21 TAB) 10 MG (21) TBPK tablet 6 tabs for 1 day, then 5 tabs for 1 das, then 4 tabs for 1 day, then 3 tabs for 1 day, 2 tabs for 1 day, then 1 tab for 1 day 21 tablet Liberti Appleton A, NP   colchicine 0.6 MG tablet Take 1 tablet (0.6 mg  total) by mouth 2 (two) times daily. 10 tablet Janace ArisBast, Bubba Vanbenschoten A, NP  Controlled Substance Prescriptions  Controlled Substance Registry consulted? no   Janace ArisBast, Nicloe Frontera A, NP 08/07/18 1816

## 2019-01-27 ENCOUNTER — Encounter (HOSPITAL_COMMUNITY): Payer: Self-pay | Admitting: Emergency Medicine

## 2019-01-27 ENCOUNTER — Emergency Department (HOSPITAL_COMMUNITY): Payer: BC Managed Care – PPO

## 2019-01-27 ENCOUNTER — Emergency Department (HOSPITAL_COMMUNITY)
Admission: EM | Admit: 2019-01-27 | Discharge: 2019-01-28 | Disposition: A | Discharge status: Home / Self Care | Payer: BC Managed Care – PPO | Attending: Emergency Medicine | Admitting: Emergency Medicine

## 2019-01-27 ENCOUNTER — Other Ambulatory Visit: Payer: Self-pay

## 2019-01-27 DIAGNOSIS — M79605 Pain in left leg: Secondary | ICD-10-CM

## 2019-01-27 DIAGNOSIS — Z79899 Other long term (current) drug therapy: Secondary | ICD-10-CM | POA: Insufficient documentation

## 2019-01-27 DIAGNOSIS — Z20822 Contact with and (suspected) exposure to covid-19: Secondary | ICD-10-CM

## 2019-01-27 DIAGNOSIS — U071 COVID-19: Secondary | ICD-10-CM | POA: Insufficient documentation

## 2019-01-27 LAB — CBC
HCT: 45.1 % (ref 39.0–52.0)
Hemoglobin: 13.2 g/dL (ref 13.0–17.0)
MCH: 20.8 pg — ABNORMAL LOW (ref 26.0–34.0)
MCHC: 29.3 g/dL — ABNORMAL LOW (ref 30.0–36.0)
MCV: 71 fL — ABNORMAL LOW (ref 80.0–100.0)
Platelets: 238 10*3/uL (ref 150–400)
RBC: 6.35 MIL/uL — ABNORMAL HIGH (ref 4.22–5.81)
RDW: 15.1 % (ref 11.5–15.5)
WBC: 5.2 10*3/uL (ref 4.0–10.5)
nRBC: 0 % (ref 0.0–0.2)

## 2019-01-27 LAB — BASIC METABOLIC PANEL
Anion gap: 9 (ref 5–15)
BUN: 10 mg/dL (ref 6–20)
CO2: 24 mmol/L (ref 22–32)
Calcium: 8.6 mg/dL — ABNORMAL LOW (ref 8.9–10.3)
Chloride: 102 mmol/L (ref 98–111)
Creatinine, Ser: 1.01 mg/dL (ref 0.61–1.24)
GFR calc Af Amer: >60 mL/min (ref >60)
GFR calc non Af Amer: >60 mL/min (ref >60)
Glucose, Bld: 129 mg/dL — ABNORMAL HIGH (ref 70–99)
Potassium: 3.3 mmol/L — ABNORMAL LOW (ref 3.5–5.1)
Sodium: 135 mmol/L (ref 135–145)

## 2019-01-27 NOTE — ED Triage Notes (Signed)
Pt reports blood clot in left leg 6 months ago. Endorses pain in leg and all over body. Reports the pain is all over, including his skin, lungs and throat.

## 2019-01-28 ENCOUNTER — Emergency Department (HOSPITAL_BASED_OUTPATIENT_CLINIC_OR_DEPARTMENT_OTHER): Payer: BC Managed Care – PPO

## 2019-01-28 DIAGNOSIS — M79609 Pain in unspecified limb: Secondary | ICD-10-CM

## 2019-01-28 LAB — POC SARS CORONAVIRUS 2 AG -  ED: SARS Coronavirus 2 Ag: NEGATIVE

## 2019-01-28 LAB — SARS CORONAVIRUS 2 (TAT 6-24 HRS): SARS Coronavirus 2: POSITIVE — AB

## 2019-01-28 MED ORDER — ACETAMINOPHEN 325 MG PO TABS
650.0000 mg | ORAL_TABLET | Freq: Once | ORAL | Status: AC
  Administered 2019-01-28: 650 mg via ORAL
  Filled 2019-01-28: qty 2

## 2019-01-28 NOTE — Progress Notes (Signed)
VASCULAR LAB PRELIMINARY  PRELIMINARY  PRELIMINARY  PRELIMINARY  Left lower extremity venous duplex completed.    Preliminary report:  See CV proc for preliminary results.  Gave report to [employer], PA-C  Sandford Diop, RVT 01/28/2019, 10:19 AM

## 2019-01-28 NOTE — Discharge Instructions (Addendum)
Your blood pressure today was elevated you need to follow up with a primary care doctor to have this rechecked within 1 week. If you do not have one then please call the number provided in your discharge paperwork to help find one  You were seen in the ED for leg pain and body aches.  I suspect you have a virus. We tested your for COVID-19 (coronavirus) infection.  It is also possible you could have other viral upper respiratory infection from another virus.    Test results come back in 48 hours, sometimes sooner.  Someone will call you if ypu are positive for COVID. If the result is negative you can see it on your MyChart.  Treatment of your illness and symptoms will include self-isolation, monitoring of symptoms and supportive care with over-the-counter medicines.    Return to the ED if there is increased work of breathing, shortness of breath, inability to tolerate fluids, weakness, chest pain.  If your test results are POSITIVE, the following isolation requirements need to be met to return to work and resume essential activities: At least 10 days since symptom onset  72 hours of absence of fever without antifever medicine tylenol (acetaminophen). A fever is temperature of 100.72F or greater. Improvement of respiratory symptoms  If your test is NEGATIVE, you may return to work and essential activities as long as your symptoms have improved and you do not have a fever for a total of 3 days.  Call your job and notify them that your test result was negative to see if they will allow you to return to work.   Stay well-hydrated. Rest. You can use over the counter medications to help with symptoms: 600 mg ibuprofen (motrin, aleve, advil) or acetaminophen (tylenol) every 6 hours, around the clock to help with associated fevers, sore throat, headaches, generalized body aches and malaise.  Oxymetazoline (afrin) intranasal spray once daily for no more than 3 days to help with congestion, after 3 days you  can switch to another over-the-counter nasal steroid spray such as fluticasone (flonase) Allergy medication (loratadine, cetirizine, etc) and phenylephrine (sudafed) help with nasal congestion, runny nose and postnasal drip.   Dextromethorphan (Delsym) to suppress dry cough. Frequent coughing is likely causing your chest wall pain Wash your hands often to prevent spread.

## 2019-01-28 NOTE — ED Provider Notes (Signed)
[facility] [place] [facility] EMERGENCY DEPARTMENT Provider Note   CSN: [id number] Arrival date & time: 01/27/19  1830     History Chief Complaint  Patient presents with  . Leg Pain    Danny Kim is a 49 y.o. male with past medical history significant for DVT on xarelto, gout presents to emergency department today with chief complaint of leg pain x 2 days and generalized body aches x3 days. He is reporting aching pain in the left thigh.  He rates the pain 4 out of 10 in severity.  He states he first noticed the pain after waking up yesterday morning when stretching.  Pain is intermittent.  Pain is worse with movement.  He is able to walk and bear weight without difficulty. He states this feels like a pulled muscle. He has had gout in his feet and states this does not feel similar. He denies history of injury, trauma or fall.   Patient reports he was around his friend who later tested positive for covid shortly after hanging out.  He is also endorsing subjective fever and chills.  He has not taken any medications for symptoms prior to arrival. He denies congestion, sore throat, chest pain, abdominal pain, nausea, vomiting, numbness, weakness, decreased sensation.   Chart review shows he was found to have DVT involving left peroneal vein on 06/18/2018. He reports compliance with xarelto.  Past Medical History:  Diagnosis Date  . DVT (deep venous thrombosis) ([place])   . Generalized headaches   . Gout     There are no problems to display for this patient.   History reviewed. No pertinent surgical history.     No family history on file.  Social History   Tobacco Use  . Smoking status: Never Smoker  . Smokeless tobacco: Never Used  Substance Use Topics  . Alcohol use: No  . Drug use: No    Home Medications Prior to Admission medications   Medication Sig Start Date End Date Taking? Authorizing Provider  acetaminophen (TYLENOL) 500 MG tablet Take 500-1,000 mg by mouth every 6 (six)  hours as needed (for pain or headaches).   Yes [provider]  colchicine 0.6 MG tablet Take 1 tablet (0.6 mg total) by mouth 2 (two) times daily. 08/03/18  Yes Bast, Traci A, NP  ibuprofen (ADVIL,MOTRIN) 200 MG tablet Take 400 mg by mouth daily as needed for mild pain.   Yes [provider]  Pseudoeph-Doxylamine-DM-APAP (NYQUIL PO) Take 2 tablets by mouth daily as needed (cold symptoms).    Yes [provider]  Pseudoephedrine-APAP-DM (DAYQUIL PO) Take 2 tablets by mouth daily as needed (cold symptoms).   Yes [provider]  amLODipine (NORVASC) 2.5 MG tablet Take 1 tablet (2.5 mg total) by mouth daily. Patient not taking: Reported on 01/28/2019 08/03/18   Loura Halt A, NP  omeprazole (PRILOSEC) 20 MG capsule Take 1 capsule (20 mg total) by mouth daily. Patient not taking: Reported on 12/23/2016 10/27/16   Carlisle Cater, PA-C  predniSONE (STERAPRED UNI-PAK 21 TAB) 10 MG (21) TBPK tablet 6 tabs for 1 day, then 5 tabs for 1 das, then 4 tabs for 1 day, then 3 tabs for 1 day, 2 tabs for 1 day, then 1 tab for 1 day Patient not taking: Reported on 01/28/2019 08/03/18   Orvan July, NP  Rivaroxaban 15 & 20 MG TBPK Take as directed on package: Start with one 4m tablet by mouth twice a day with food. On Day 22, switch  to one 57m tablet once a day with food. Patient not taking: Reported on 01/28/2019 06/18/18   Caccavale, Sophia, PA-C  dicyclomine (BENTYL) 20 MG tablet Take 1 tablet (20 mg total) by mouth 2 (two) times daily. 12/23/16 08/03/18  KDelia Heady PA-C    Allergies    Bee venom  Review of Systems   Review of Systems All other systems are reviewed and are negative for acute change except as noted in the HPI.  Physical Exam Updated Vital Signs BP (!) 150/101   Pulse 84   Temp 98.5 F (36.9 C) (Oral)   Resp 16   SpO2 99%   Physical Exam Vitals and nursing note reviewed.  Constitutional:      General: He is not in acute distress.    Appearance: He is not  ill-appearing.  HENT:     Head: Normocephalic and atraumatic.     Right Ear: Tympanic membrane and external ear normal.     Left Ear: Tympanic membrane and external ear normal.     Nose: Nose normal.     Mouth/Throat:     Mouth: Mucous membranes are moist.     Pharynx: Oropharynx is clear.  Eyes:     General: No scleral icterus.       Right eye: No discharge.        Left eye: No discharge.     Extraocular Movements: Extraocular movements intact.     Conjunctiva/sclera: Conjunctivae normal.     Pupils: Pupils are equal, round, and reactive to light.  Neck:     Vascular: No JVD.  Cardiovascular:     Rate and Rhythm: Normal rate and regular rhythm.     Pulses: Normal pulses.          Radial pulses are 2+ on the right side and 2+ on the left side.     Heart sounds: Normal heart sounds.  Pulmonary:     Comments: Lungs clear to auscultation in all fields. Symmetric chest rise. No wheezing, rales, or rhonchi. Chest:     Chest wall: No tenderness.  Abdominal:     Comments: Abdomen is soft, non-distended, and non-tender in all quadrants. No rigidity, no guarding. No peritoneal signs.  Musculoskeletal:        General: Normal range of motion.     Cervical back: Normal range of motion.     Comments: Tenderness to palpation of left anterior thigh. No overlying skin changes, no erythema, edema, or warmth.  No swelling noted to left calf without palpable cords, compartment soft. Full ROM of left lower extremity. Able to wiggle toes, brisk cap refill. DP pulses 2+ bilaterally.    Skin:    General: Skin is warm and dry.     Capillary Refill: Capillary refill takes less than 2 seconds.  Neurological:     Mental Status: He is oriented to person, place, and time.     GCS: GCS eye subscore is 4. GCS verbal subscore is 5. GCS motor subscore is 6.     Comments: Fluent speech, no facial droop.  Psychiatric:        Behavior: Behavior normal.       ED Results / Procedures / Treatments   Labs  (all labs ordered are listed, but only abnormal results are displayed) Labs Reviewed  CBC - Abnormal; Notable for the following components:      Result Value   RBC 6.35 (*)    MCV 71.0 (*)    MCH 20.8 (*)  MCHC 29.3 (*)    All other components within normal limits  BASIC METABOLIC PANEL - Abnormal; Notable for the following components:   Potassium 3.3 (*)    Glucose, Bld 129 (*)    Calcium 8.6 (*)    All other components within normal limits  SARS CORONAVIRUS 2 (TAT 6-24 HRS)  POC SARS CORONAVIRUS 2 AG -  ED    EKG None  Radiology DG Chest 2 View  Result Date: 01/27/2019 CLINICAL DATA:  Chest pain, history of prior DVT EXAM: CHEST - 2 VIEW COMPARISON:  06/29/2018 FINDINGS: Cardiac shadow is within normal limits. The lungs are well aerated bilaterally. Minimal left basilar atelectasis is seen. No bony abnormality is noted. IMPRESSION: Minimal left basilar atelectasis. Electronically Signed   By: Inez Catalina M.D.   On: 01/27/2019 19:49   VAS [place] LOWER EXTREMITY VENOUS (DVT) (ONLY [facility])  Result Date: 01/28/2019  Lower Venous Study Indications: Pain.  Risk Factors: DVT 5/24/ 2020 LLE peroneal. Comparison Study: Prior negative LLEV study done 06/29/18 Performing Technologist: Sharion Dove RVS  Examination Guidelines: A complete evaluation includes B-mode imaging, spectral Doppler, color Doppler, and power Doppler as needed of all accessible portions of each vessel. Bilateral testing is considered an integral part of a complete examination. Limited examinations for reoccurring indications may be performed as noted.  +-----+---------------+---------+-----------+----------+--------------+ RIGHTCompressibilityPhasicitySpontaneityPropertiesThrombus Aging +-----+---------------+---------+-----------+----------+--------------+ CFV  Full           Yes      Yes                                 +-----+---------------+---------+-----------+----------+--------------+    +---------+---------------+---------+-----------+----------+--------------+ LEFT     CompressibilityPhasicitySpontaneityPropertiesThrombus Aging +---------+---------------+---------+-----------+----------+--------------+ CFV      Full           Yes      Yes                                 +---------+---------------+---------+-----------+----------+--------------+ SFJ      Full                                                        +---------+---------------+---------+-----------+----------+--------------+ FV Prox  Full                                                        +---------+---------------+---------+-----------+----------+--------------+ FV Mid   Full                                                        +---------+---------------+---------+-----------+----------+--------------+ FV DistalFull                                                        +---------+---------------+---------+-----------+----------+--------------+ PFV  Full                                                        +---------+---------------+---------+-----------+----------+--------------+ POP      Full           Yes      Yes                                 +---------+---------------+---------+-----------+----------+--------------+ PTV      Full                                                        +---------+---------------+---------+-----------+----------+--------------+ PERO     Full                                                        +---------+---------------+---------+-----------+----------+--------------+     Summary: Right: No evidence of common femoral vein obstruction. Left: Findings appear essentially unchanged compared to previous examination. There is no evidence of deep vein thrombosis in the lower extremity.  *See table(s) above for measurements and observations. Electronically signed by Deitra Mayo MD on 01/28/2019 at 11:02:30 AM.     Final     Procedures Procedures (including critical care time)  Medications Ordered in ED Medications  acetaminophen (TYLENOL) tablet 650 mg (650 mg Oral Given 01/28/19 9381)    ED Course  I have reviewed the triage vital signs and the nursing notes.  Pertinent labs & imaging results that were available during my care of the patient were reviewed by me and considered in my medical decision making (see chart for details).  Clinical Course as of Jan 27 945  Sun Jan 28, 2019  0730 CBC without leukocytosis, stable hemoglobin 13.5  CBC(!) [[facility]]  0730 No severe electrolyte derangement but there is mild hypokalemia 3.3. No renal insufficiency. Normal anion gap  Basic metabolic panel(!) [[facility]]  0175 Minimal left basilar atelectasis, no infiltrates or opacities seen  DG Chest 2 View [[facility]]  0900 Rapid covid negative, still suspect possible covid  POC SARS Coronavirus 2 Ag-ED - Nasal Swab (BD Veritor Kit) [[facility]]  0930 Negative for DVT  VAS [place] LOWER EXTREMITY VENOUS (DVT) (ONLY [facility]) [[facility]]    Clinical Course User Index [[facility]] Cherre Robins, PA-C   Vitals:   01/28/19 0800 01/28/19 0830 01/28/19 0900 01/28/19 0930  BP: (!) 133/100 (!) 151/105 (!) 143/108 (!) 140/100  Pulse: 87 73 74 71  Resp: (!) 21 19 20 17   Temp:      TempSrc:      SpO2: 96% 97% 98% 98%     MDM Rules/Calculators/A&P                      Patient seen and examined. His vitals in triage show low grade temp of 99.5, BP 163/95, no tachycardia or hypoxia. Patient unfortunately had 12 hour wait in the lobby due to high  volume. Once in the exam room, I rechecked patient's temperature and afebrile at 98.5 His lungs are clear to auscultation, normal work of breathing. No abdominal tenderness. No signs of gout or septic joint. He has tenderness to palpation of left anterior thigh without overlying skin changes. No swelling in the leg, neurovascularly intact. however given history DVT study ordered and is negative for DVT.  Significant labs as documented in ED course above. Chest xray viewed by me without infiltrate but does show minimal left basilar atelectasis. Rapid covid test was negative however still have high suspicion for covid, 6-24 hour test performed to confirm. I ambulated in the emergency department without respiratory distress, tachycardia, or hypoxia. SpO2 during ambulation >94% on room air. Will discharge home with symptomatic care. Patient aware he needs to self quarantine until he has the test result, also discussed symptomatic care if test is positive. The patient appears reasonably screened and/or stabilized for discharge and I doubt any other medical condition or other [facility] requiring further screening, evaluation, or treatment in the ED at this time prior to discharge. The patient is safe for discharge with strict return precautions discussed. Recommend pcp follow up. Recommend he have blood pressure checked in 1 week as it was elevated today, no signs of hypertensive urgency or emergency. Hensley Treat was evaluated in Emergency Department on 01/28/2019 for the symptoms described in the history of present illness. He was evaluated in the context of the global COVID-19 pandemic, which necessitated consideration that the patient might be at risk for infection with the SARS-CoV-2 virus that causes COVID-19. Institutional protocols and algorithms that pertain to the evaluation of patients at risk for COVID-19 are in a state of rapid change based on information released by regulatory bodies including the CDC and federal and state organizations. These policies and algorithms were followed during the patient's care in the ED.   Portions of this note were generated with [employer]. Dictation errors may occur despite best attempts at proofreading.    Final Clinical Impression(s) / ED Diagnoses Final diagnoses:  Close exposure to COVID-19 virus  Left leg pain    Rx / DC Orders ED Discharge Orders     None       Flint Melter 01/28/19 1906    Virgel Manifold, MD 01/29/19 [phone]

## 2019-01-30 ENCOUNTER — Telehealth (HOSPITAL_COMMUNITY): Payer: Self-pay

## 2019-02-09 ENCOUNTER — Ambulatory Visit: Payer: BC Managed Care – PPO | Attending: Internal Medicine

## 2019-02-09 DIAGNOSIS — Z20822 Contact with and (suspected) exposure to covid-19: Secondary | ICD-10-CM

## 2019-02-10 LAB — NOVEL CORONAVIRUS, NAA: SARS-CoV-2, NAA: NOT DETECTED

## 2019-11-16 ENCOUNTER — Encounter (HOSPITAL_COMMUNITY): Payer: Self-pay

## 2019-11-16 ENCOUNTER — Other Ambulatory Visit: Payer: Self-pay

## 2019-11-16 ENCOUNTER — Ambulatory Visit (HOSPITAL_COMMUNITY)
Admission: EM | Admit: 2019-11-16 | Discharge: 2019-11-16 | Disposition: A | Discharge status: Home / Self Care | Payer: BLUE CROSS/BLUE SHIELD | Attending: Family Medicine | Admitting: Family Medicine

## 2019-11-16 DIAGNOSIS — M25572 Pain in left ankle and joints of left foot: Secondary | ICD-10-CM

## 2019-11-16 DIAGNOSIS — M79662 Pain in left lower leg: Secondary | ICD-10-CM

## 2019-11-16 MED ORDER — PREDNISONE 10 MG (21) PO TBPK: ORAL_TABLET | ORAL | 0 refills | Status: DC

## 2019-11-16 NOTE — ED Triage Notes (Signed)
Pt c/o pain, swelling to left posterior/achilles area left foot for approx 3 days.  Also c/o pain, swelling, warmth to left calf, posterior knee onset yesterday.  Also states he had sharp pain up left leg, into left rib cage and chest earlier today sharp in nature with SOB that pt attributes to pain in foot being so intense. H/o of DVT to left leg.  States pain to chest/rib area improved and no SOB at present. Took Aleve this morning, Advil last night. Not taking any medications for HTN.

## 2019-11-16 NOTE — Discharge Instructions (Addendum)
Treating for possible gout flare. You also have a heel spur in the left foot that could be causing your pain.  Treating this with prednisone. Rest  I do not believe this is a blood clot at this time.  If your symptoms do not improve or worsen you will need to go to the ER.  Follow up as needed for continued or worsening symptoms

## 2019-11-19 NOTE — ED Provider Notes (Signed)
[facility]    CSN: [id number] Arrival date & time: 11/16/19  1300      History   Chief Complaint Chief Complaint  Patient presents with  . Foot Pain    HPI Danny Kim is a 49 y.o. male.   Patient is a 49 year old male that presents today with left foot pain x3 days.  History of gout.  Reporting the pain radiates up the left leg.-On calf pain.  Does have history of DVT in that extremity.  Currently not taking blood thinners.  Denies any injuries to the foot.  Took Aleve this morning for pain.  Denies any chest pain or shortness of breath.       Past Medical History:  Diagnosis Date  . DVT (deep venous thrombosis) ([facility])   . Generalized headaches   . Gout     There are no problems to display for this patient.   History reviewed. No pertinent surgical history.     Home Medications    Prior to Admission medications   Medication Sig Start Date End Date Taking? Authorizing Provider  ibuprofen (ADVIL,MOTRIN) 200 MG tablet Take 400 mg by mouth daily as needed for mild pain.   Yes [provider]  acetaminophen (TYLENOL) 500 MG tablet Take 500-1,000 mg by mouth every 6 (six) hours as needed (for pain or headaches).    [provider]  colchicine 0.6 MG tablet Take 1 tablet (0.6 mg total) by mouth 2 (two) times daily. 08/03/18   Kamilya Wakeman, Gloris Manchester A, NP  predniSONE (STERAPRED UNI-PAK 21 TAB) 10 MG (21) TBPK tablet 6 tabs for 1 day, then 5 tabs for 1 das, then 4 tabs for 1 day, then 3 tabs for 1 day, 2 tabs for 1 day, then 1 tab for 1 day 11/16/19   Dahlia Byes A, NP  Pseudoeph-Doxylamine-DM-APAP (NYQUIL PO) Take 2 tablets by mouth daily as needed (cold symptoms).     [provider]  Pseudoephedrine-APAP-DM (DAYQUIL PO) Take 2 tablets by mouth daily as needed (cold symptoms).    [provider]  amLODipine (NORVASC) 2.5 MG tablet Take 1 tablet (2.5 mg total) by mouth daily. Patient not taking: Reported on 01/28/2019 08/03/18 11/16/19  Dahlia Byes A, NP  dicyclomine (BENTYL) 20 MG tablet Take 1 tablet (20 mg total) by mouth 2 (two) times daily. 12/23/16 08/03/18  Khatri, Hina, PA-C  omeprazole (PRILOSEC) 20 MG capsule Take 1 capsule (20 mg total) by mouth daily. Patient not taking: Reported on 12/23/2016 10/27/16 11/16/19  Renne Crigler, PA-C  Rivaroxaban 15 & 20 MG TBPK Take as directed on package: Start with one 15mg  tablet by mouth twice a day with food. On Day 22, switch to one 20mg  tablet once a day with food. Patient not taking: Reported on 01/28/2019 06/18/18 11/16/19  06/20/18, PA-C    Family History History reviewed. No pertinent family history.  Social History Social History   Tobacco Use  . Smoking status: Never Smoker  . Smokeless tobacco: Never Used  Substance Use Topics  . Alcohol use: No  . Drug use: No     Allergies   Bee venom   Review of Systems Review of Systems   Physical Exam Triage Vital Signs ED Triage Vitals  Enc Vitals Group     BP 11/16/19 1448 (!) 155/100     Pulse Rate 11/16/19 1448 71     Resp 11/16/19 1448 17     Temp 11/16/19 1448 97.8 F (36.6 C)  Temp Source 11/16/19 1448 Oral     SpO2 11/16/19 1448 97 %     Weight --      Height --      Head Circumference --      Peak Flow --      Pain Score 11/16/19 1445 7     Pain Loc --      Pain Edu? --      Excl. in GC? --    No data found.  Updated Vital Signs BP (!) 155/100 (BP Location: Right Arm)   Pulse 71   Temp 97.8 F (36.6 C) (Oral)   Resp 17   SpO2 97%   Visual Acuity Right Eye Distance:   Left Eye Distance:   Bilateral Distance:    Right Eye Near:   Left Eye Near:    Bilateral Near:     Physical Exam Vitals and nursing note reviewed.  Constitutional:      Appearance: Normal appearance.  HENT:     Head: Normocephalic and atraumatic.     Nose: Nose normal.  Eyes:     Conjunctiva/sclera: Conjunctivae normal.  Pulmonary:     Effort: Pulmonary effort is normal.  Musculoskeletal:         General: Normal range of motion.     Cervical back: Normal range of motion.     Right lower leg: No edema.     Left lower leg: No edema.       Feet:     Comments: Non tender to palpation of the left calf. No swelling.   Feet:     Comments: TTP, no swelling, bruising or erythema.  Non tender to achilles.  Skin:    General: Skin is warm and dry.  Neurological:     Mental Status: He is alert.  Psychiatric:        Mood and Affect: Mood normal.      UC Treatments / Results  Labs (all labs ordered are listed, but only abnormal results are displayed) Labs Reviewed - No data to display  EKG   Radiology No results found.  Procedures Procedures (including critical care time)  Medications Ordered in UC Medications - No data to display  Initial Impression / Assessment and Plan / UC Course  I have reviewed the triage vital signs and the nursing notes.  Pertinent labs & imaging results that were available during my care of the patient were reviewed by me and considered in my medical decision making (see chart for details).     Pain of left foot Most likely related to gout versus heel spur Treat with prednisone. No concern for DVT at this time. Rest and follow-up for any continued or worsening problems. Final Clinical Impressions(s) / UC Diagnoses   Final diagnoses:  Pain of joint of left ankle and foot     Discharge Instructions     Treating for possible gout flare. You also have a heel spur in the left foot that could be causing your pain.  Treating this with prednisone. Rest  I do not believe this is a blood clot at this time.  If your symptoms do not improve or worsen you will need to go to the ER.  Follow up as needed for continued or worsening symptoms      ED Prescriptions    Medication Sig Dispense Auth. Provider   predniSONE (STERAPRED UNI-PAK 21 TAB) 10 MG (21) TBPK tablet 6 tabs for 1 day, then 5 tabs for 1 das,  then 4 tabs for 1 day, then 3 tabs for  1 day, 2 tabs for 1 day, then 1 tab for 1 day 21 tablet Danny Kim A, NP     PDMP not reviewed this encounter.   Janace Aris, NP 11/19/19 [phone]

## 2020-01-21 ENCOUNTER — Encounter: Payer: Self-pay | Admitting: Family Medicine

## 2020-01-21 ENCOUNTER — Ambulatory Visit (INDEPENDENT_AMBULATORY_CARE_PROVIDER_SITE_OTHER): Payer: BLUE CROSS/BLUE SHIELD | Admitting: Family Medicine

## 2020-01-21 ENCOUNTER — Other Ambulatory Visit: Payer: Self-pay

## 2020-01-21 VITALS — BP 124/80 | HR 66 | Wt 176.8 lb

## 2020-01-21 DIAGNOSIS — R220 Localized swelling, mass and lump, head: Secondary | ICD-10-CM | POA: Diagnosis not present

## 2020-01-21 DIAGNOSIS — Z7689 Persons encountering health services in other specified circumstances: Secondary | ICD-10-CM

## 2020-01-21 DIAGNOSIS — M109 Gout, unspecified: Secondary | ICD-10-CM | POA: Insufficient documentation

## 2020-01-21 DIAGNOSIS — Z23 Encounter for immunization: Secondary | ICD-10-CM

## 2020-01-21 DIAGNOSIS — R739 Hyperglycemia, unspecified: Secondary | ICD-10-CM

## 2020-01-21 DIAGNOSIS — M1A9XX0 Chronic gout, unspecified, without tophus (tophi): Secondary | ICD-10-CM

## 2020-01-21 DIAGNOSIS — Z412 Encounter for routine and ritual male circumcision: Secondary | ICD-10-CM | POA: Diagnosis not present

## 2020-01-21 DIAGNOSIS — Z13220 Encounter for screening for lipoid disorders: Secondary | ICD-10-CM

## 2020-01-21 MED ORDER — COLCHICINE 0.6 MG PO TABS: 0.6000 mg | ORAL_TABLET | Freq: Two times a day (BID) | ORAL | 0 refills | Status: DC | PRN

## 2020-01-21 NOTE — Patient Instructions (Addendum)
It was nice seeing you today, Danny Kim.  I have refilled your colchicine for gout to use as needed for flareups.  I have sent a referral for ENT to have the mass behind your right ear removed.  I suspect this is a benign mass known as a lipoma, which is a collection of fat cells.  I have sent a referral for urology for circumcision.  Please return in 3 months for follow-up.  Stay well, [name], MD

## 2020-01-21 NOTE — Progress Notes (Signed)
    SUBJECTIVE:   CHIEF COMPLAINT / HPI: Establish care  Head mass Patient reports having a mass behind his right ear that he first noticed 5 years ago He states this has been gradually growing in size and is now bothering him more, occasionally painful He is requesting a referral to have this removed He also has another mass on the top of his head which does not bother him Denies fever, chills  Gout Reports gout flare-ups 1-2 times/year Has had colchicine to take as needed for flare-ups, requesting refill Does not want daily preventative medication, concerns about cost Denies alcohol use  Social history Works at [employer] at home with wife, 5 sons, 2 daughters, and daughter-in-law Denies smoking, alcohol, and recreational drug use Enjoys fishing for fun No regular exercise Reports balanced diet  PERTINENT  PMH / PSH: DVT (left peroneal vein, 2020, treated with rivaroxaban for 7 months but stopped due to inability to afford medication), gout  OBJECTIVE:   BP 124/80   Pulse 66   Wt 176 lb 12.8 oz (80.2 kg)   SpO2 97%   BMI 30.35 kg/m   General: Obese male, NAD HEENT: approximately 3 cm soft rubbery mobile mass in the right post-auricular region consistent with lipoma, another approximately 2 cm x 1 cm hard bony non-mobile mass to top of scalp, see images below Lymph: no anterior or posterior cervical adenopathy, no occipital adenopathy, no axillary adenopathy CV: RRR, no murmurs Pulm: CTAB, no wheezes or rales Right ear   Top of scalp    ASSESSMENT/PLAN:   Mass of head 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 Could also consider maligancy, will obtain labs to assess. - ENT referral - CBC w/ diff, BMP   Circumcision Patient requesting referral for circumcision. - urology referral  History of DVT In left peroneal vein adequately treated with rivaroxaban for 7 months. No need for anticoagulation at this time.  HCM - flu vaccine given  today - Tdap given today - needs colonoscopy, will discuss at next visit - screening labs: A1c, lipid panel  [name], MD [facility]

## 2020-01-21 NOTE — Assessment & Plan Note (Signed)
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 Could also consider maligancy, will obtain labs to assess. - ENT referral - CBC w/ diff, BMP

## 2020-01-21 NOTE — Assessment & Plan Note (Signed)
Reports flare-ups 1-2 years. Does not want daily preventative medication at this time. - colchicine refilled to take prn

## 2020-01-22 LAB — BASIC METABOLIC PANEL
BUN/Creatinine Ratio: 16 (ref 9–20)
BUN: 16 mg/dL (ref 6–24)
CO2: 22 mmol/L (ref 20–29)
Calcium: 9.3 mg/dL (ref 8.7–10.2)
Chloride: 104 mmol/L (ref 96–106)
Creatinine, Ser: 0.97 mg/dL (ref 0.76–1.27)
GFR calc Af Amer: 106 mL/min/{1.73_m2} (ref >59)
GFR calc non Af Amer: 91 mL/min/{1.73_m2} (ref >59)
Glucose: 92 mg/dL (ref 65–99)
Potassium: 4.6 mmol/L (ref 3.5–5.2)
Sodium: 140 mmol/L (ref 134–144)

## 2020-01-22 LAB — CBC WITH DIFFERENTIAL/PLATELET
Basophils Absolute: 0.1 10*3/uL (ref 0.0–0.2)
Basos: 1 %
EOS (ABSOLUTE): 0.5 10*3/uL — ABNORMAL HIGH (ref 0.0–0.4)
Eos: 7 %
Hematocrit: 47.1 % (ref 37.5–51.0)
Hemoglobin: 14.3 g/dL (ref 13.0–17.7)
Immature Grans (Abs): 0.1 10*3/uL (ref 0.0–0.1)
Immature Granulocytes: 1 %
Lymphocytes Absolute: 1.6 10*3/uL (ref 0.7–3.1)
Lymphs: 22 %
MCH: 21.9 pg — ABNORMAL LOW (ref 26.6–33.0)
MCHC: 30.4 g/dL — ABNORMAL LOW (ref 31.5–35.7)
MCV: 72 fL — ABNORMAL LOW (ref 79–97)
Monocytes Absolute: 0.8 10*3/uL (ref 0.1–0.9)
Monocytes: 10 %
Neutrophils Absolute: 4.4 10*3/uL (ref 1.4–7.0)
Neutrophils: 59 %
Platelets: 283 10*3/uL (ref 150–450)
RBC: 6.52 x10E6/uL — ABNORMAL HIGH (ref 4.14–5.80)
RDW: 15.1 % (ref 11.6–15.4)
WBC: 7.3 10*3/uL (ref 3.4–10.8)

## 2020-01-22 LAB — LIPID PANEL
Chol/HDL Ratio: 5.6 ratio — ABNORMAL HIGH (ref 0.0–5.0)
Cholesterol, Total: 222 mg/dL — ABNORMAL HIGH (ref 100–199)
HDL: 40 mg/dL (ref >39)
LDL Chol Calc (NIH): 137 mg/dL — ABNORMAL HIGH (ref 0–99)
Triglycerides: 252 mg/dL — ABNORMAL HIGH (ref 0–149)
VLDL Cholesterol Cal: 45 mg/dL — ABNORMAL HIGH (ref 5–40)

## 2020-01-22 LAB — HEMOGLOBIN A1C
Est. average glucose Bld gHb Est-mCnc: 103 mg/dL
Hgb A1c MFr Bld: 5.2 % (ref 4.8–5.6)

## 2020-02-10 ENCOUNTER — Ambulatory Visit (HOSPITAL_COMMUNITY)
Admission: EM | Admit: 2020-02-10 | Discharge: 2020-02-10 | Disposition: A | Discharge status: Home / Self Care | Payer: BLUE CROSS/BLUE SHIELD | Attending: Family Medicine | Admitting: Family Medicine

## 2020-02-10 ENCOUNTER — Encounter (HOSPITAL_COMMUNITY): Payer: Self-pay | Admitting: Emergency Medicine

## 2020-02-10 ENCOUNTER — Other Ambulatory Visit: Payer: Self-pay

## 2020-02-10 ENCOUNTER — Ambulatory Visit (INDEPENDENT_AMBULATORY_CARE_PROVIDER_SITE_OTHER): Payer: BLUE CROSS/BLUE SHIELD

## 2020-02-10 DIAGNOSIS — M25562 Pain in left knee: Secondary | ICD-10-CM

## 2020-02-10 DIAGNOSIS — M25462 Effusion, left knee: Secondary | ICD-10-CM | POA: Diagnosis not present

## 2020-02-10 MED ORDER — PREDNISONE 20 MG PO TABS: 40.0000 mg | ORAL_TABLET | Freq: Every day | ORAL | 0 refills | Status: DC

## 2020-02-10 MED ORDER — KETOROLAC TROMETHAMINE 60 MG/2ML IM SOLN
INTRAMUSCULAR | Status: AC
  Filled 2020-02-10: qty 2

## 2020-02-10 MED ORDER — METHYLPREDNISOLONE SODIUM SUCC 125 MG IJ SOLR: 125.0000 mg | Freq: Once | INTRAMUSCULAR | Status: DC

## 2020-02-10 MED ORDER — DEXAMETHASONE SODIUM PHOSPHATE 10 MG/ML IJ SOLN
INTRAMUSCULAR | Status: AC
  Filled 2020-02-10: qty 1

## 2020-02-10 MED ORDER — DEXAMETHASONE SODIUM PHOSPHATE 10 MG/ML IJ SOLN: 10.0000 mg | Freq: Once | INTRAMUSCULAR | Status: DC

## 2020-02-10 MED ORDER — KETOROLAC TROMETHAMINE 60 MG/2ML IM SOLN: 60.0000 mg | Freq: Once | INTRAMUSCULAR | Status: DC

## 2020-02-10 MED ORDER — TIZANIDINE HCL 4 MG PO TABS: 4.0000 mg | ORAL_TABLET | Freq: Every evening | ORAL | 0 refills | Status: DC | PRN

## 2020-02-10 MED ORDER — METHYLPREDNISOLONE SODIUM SUCC 125 MG IJ SOLR
INTRAMUSCULAR | Status: AC
  Filled 2020-02-10: qty 2

## 2020-02-10 NOTE — ED Triage Notes (Signed)
Pt c/o left leg pain x 3 weeks. Pt states yesterday it got worse. Pt states 3-4 years ago he had a blood clot in that leg. He states it feels like someone is drilling behind his knee.

## 2020-02-10 NOTE — Discharge Instructions (Addendum)
Start prednisone 40 mg once daily x 5 days. Tizanidine 4 mg at bedtime for pain. Purchase an over the counter knee brace and wear while working or while doing any activity that requires weight bearing on knee for prolonged period of time.

## 2020-02-10 NOTE — ED Provider Notes (Signed)
[facility]    CSN: [id number] Arrival date & time: 02/10/20  1017      History   Chief Complaint Chief Complaint  Patient presents with  . Leg Pain    HPI Danny Kim is a 50 y.o. male.   He is accompanied by spouse who is providing interpretation during visit.  HPI  Patient presents today with a complaint of posterior left knee pain.  Patient's medical history is significant for DVT and gout.  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  Knee pain has been present for more than 3 weeks.  He has intermittently taken Tylenol and ibuprofen without any relief of pain.  Denies any swelling or redness involving the left knee.  Pain is localized, and does not radiate. Patient denies any prior injury involving the left posterior or anterior knee.  He denies any remote or recent injury involving the left knee.  Denies falls. Past Medical History:  Diagnosis Date  . DVT (deep venous thrombosis) (HCC)   . Generalized headaches   . Gout     Patient Active Problem List   Diagnosis Date Noted  . Mass of head 01/21/2020  . Gout 01/21/2020    No past surgical history on file.     Home Medications    Prior to Admission medications   Medication Sig Start Date End Date Taking? Authorizing Provider  acetaminophen (TYLENOL) 500 MG tablet Take 500-1,000 mg by mouth every 6 (six) hours as needed (for pain or headaches).    [provider]  colchicine 0.6 MG tablet Take 1 tablet (0.6 mg total) by mouth 2 (two) times daily as needed. 01/21/20   [name], MD  ibuprofen (ADVIL,MOTRIN) 200 MG tablet Take 400 mg by mouth daily as needed for mild pain.    [provider]  Pseudoeph-Doxylamine-DM-APAP (NYQUIL PO) Take 2 tablets by mouth daily as needed (cold symptoms).     [provider]  Pseudoephedrine-APAP-DM (DAYQUIL PO) Take 2 tablets by mouth daily as needed (cold symptoms).    [provider]    Family History No family history on file.  Social History Social History   Tobacco Use  . Smoking status: Never Smoker  . Smokeless tobacco: Never Used  Substance Use Topics  . Alcohol use: No  . Drug use: No     Allergies   Bee venom  Review of Systems Review of Systems Pertinent negatives listed in HPI  Physical Exam Triage Vital Signs ED Triage Vitals  Enc Vitals Group     BP      Pulse      Resp      Temp      Temp src      SpO2      Weight      Height      Head Circumference      Peak Flow      Pain Score      Pain Loc      Pain Edu?      Excl. in GC?    No data found.  Updated Vital Signs BP (!) 147/95 (BP Location: Left Arm)   Pulse 72   Temp 98.1 F (36.7 C) (Oral)   Resp 17   SpO2 97%   Visual Acuity Right Eye Distance:   Left Eye Distance:   Bilateral Distance:    Right Eye Near:  Left Eye Near:    Bilateral Near:     Physical Exam Cardiovascular:     Rate and Rhythm: Normal rate and regular rhythm.  Pulmonary:     Effort: Pulmonary effort is normal.     Breath sounds: Normal breath sounds.  Musculoskeletal:     Left knee: Bony tenderness present. No effusion or erythema. Normal range of motion. Normal patellar mobility.       Legs:  Skin:    Capillary Refill: Capillary refill takes less than 2 seconds.  Neurological:     General: No focal deficit present.     Mental Status: He is alert and oriented to person, place, and time.  Psychiatric:        Attention and Perception: Attention normal.        Mood and Affect: Mood normal.        Speech: Speech normal.        Thought Content: Thought content normal.        Cognition and Memory: Cognition normal.        Judgment: Judgment normal.    UC Treatments / Results  Labs (all labs ordered are listed, but only abnormal results are displayed) Labs Reviewed - No data to display  EKG   Radiology DG Knee Complete 4 Views Left  Result Date:  02/10/2020 CLINICAL DATA:  Left knee pain for 3 weeks with swelling, most prominent posteriorly. No reported injury. EXAM: LEFT KNEE - COMPLETE 4+ VIEW COMPARISON:  None. FINDINGS: No acute fracture, joint effusion or dislocation. Asymmetric cortical thickening with relatively smooth periosteal reaction in the posteromedial distal metaphysis of the left femur, favoring healed deformity from remote trauma. No focal osseous lesions. No radiopaque foreign body. IMPRESSION: Asymmetric cortical thickening with relatively smooth periosteal reaction in the posteromedial distal metaphysis of the left femur, favoring healed deformity from remote trauma. Correlate with injury history. If there is truly no history of injury to explain this finding, an MRI of the left femur would be indicated to exclude an underlying bone lesion. No acute osseous abnormality. Electronically Signed   By: Delbert Phenix M.D.   On: 02/10/2020 11:28    Procedures Procedures (including critical care time)  Medications Ordered in UC Medications - No data to display  Initial Impression / Assessment and Plan / UC Course  I have reviewed the triage vital signs and the nursing notes.  Pertinent labs & imaging results that were available during my care of the patient were reviewed by me and considered in my medical decision making (see chart for details).    Imaging of the knee revealed a possible remote injury related to some asymmetric cortical thickening involving the posterior medial distal metathesis of the left femur.  Patient denies any prior injury involving the left extremity.  Patient has a history of a prior DVT which she was treated for in 2020.  We will treat today for acute left knee pain with a course of prednisone.  Patient received Solu-Medrol and Toradol here in clinic today and discharged home to start prednisone tomorrow and tizanidine as needed for pain.  Advised to begin wearing a protective knee brace given that current  occupation requires patient to be weightbearing on knees for prolonged period of time.  Information given to follow-up with [facility] orthopedics if treatment failure occurs as patient may require advanced imaging to determine the source of his pain.  Discussed and definitely with spouse she verbalized understanding and agreement with plan today. Final Clinical  Impressions(s) / UC Diagnoses   Final diagnoses:  Posterior knee pain, left     Discharge Instructions     Start prednisone 40 mg once daily x 5 days. Tizanidine 4 mg at bedtime for pain. Purchase an over the counter knee brace and wear while working or while doing any activity that requires weight bearing on knee for prolonged period of time.     ED Prescriptions    Medication Sig Dispense Auth. Provider   predniSONE (DELTASONE) 20 MG tablet Take 2 tablets (40 mg total) by mouth daily with breakfast. 10 tablet Bing Neighbors, FNP   tiZANidine (ZANAFLEX) 4 MG tablet Take 1 tablet (4 mg total) by mouth at bedtime as needed for muscle spasms. 30 tablet Bing Neighbors, FNP     PDMP not reviewed this encounter.   Bing Neighbors, FNP 02/10/20 [phone]

## 2020-06-16 ENCOUNTER — Encounter (HOSPITAL_COMMUNITY): Payer: Self-pay | Admitting: Emergency Medicine

## 2020-06-16 ENCOUNTER — Emergency Department (HOSPITAL_COMMUNITY): Payer: BLUE CROSS/BLUE SHIELD

## 2020-06-16 ENCOUNTER — Emergency Department (HOSPITAL_COMMUNITY)
Admission: EM | Admit: 2020-06-16 | Discharge: 2020-06-16 | Disposition: A | Discharge status: Home / Self Care | Payer: BLUE CROSS/BLUE SHIELD | Attending: Emergency Medicine | Admitting: Emergency Medicine

## 2020-06-16 DIAGNOSIS — W132XXA Fall from, out of or through roof, initial encounter: Secondary | ICD-10-CM | POA: Insufficient documentation

## 2020-06-16 DIAGNOSIS — S2232XA Fracture of one rib, left side, initial encounter for closed fracture: Secondary | ICD-10-CM | POA: Insufficient documentation

## 2020-06-16 MED ORDER — OXYCODONE-ACETAMINOPHEN 5-325 MG PO TABS
1.0000 | ORAL_TABLET | Freq: Once | ORAL | Status: AC
  Administered 2020-06-16: 1 via ORAL
  Filled 2020-06-16: qty 1

## 2020-06-16 MED ORDER — OXYCODONE HCL 5 MG PO TABS: 5.0000 mg | ORAL_TABLET | Freq: Four times a day (QID) | ORAL | 0 refills | Status: DC | PRN

## 2020-06-16 NOTE — ED Triage Notes (Signed)
Pt fell off a roof about 5 days ago only about 6 feet , c/o left side rib area pain , no loc did not hit head

## 2020-06-16 NOTE — Discharge Instructions (Addendum)
You can take 600 mg of ibuprofen every 6 hours, you can take 1000 mg of Tylenol every 6 hours, you can alternate these every 3 or you can take them together.  If the over-the-counter pain medication regimen above does not adequately control your pain use the prescribed narcotic pain control.  Follow-up with your chronic primary care provider for more pain medication as needed.  Return to [place] with worsening symptoms.  Use the incentive spirometry, plastic device provided.  Take slow steady deep breath then multiple times per hour over the next several days to weeks.  This will help keep your lungs open in the setting of the rib pain you are having.

## 2020-06-16 NOTE — ED Provider Notes (Signed)
[facility] EMERGENCY DEPARTMENT Provider Note   CSN: [id number] Arrival date & time: 06/16/20  2585     History No chief complaint on file.   Danny Kim is a 50 y.o. male.  Patient sustained a 6 foot fall several days ago.  Has continued to have left rib pain.  Hurts with deep breathing hurts with coughing.  No shortness of breath.  No abdominal pain.  Eating and drinking normal bowel function normal urinary function.  Has tried over-the-counter medications with minimal relief.        Past Medical History:  Diagnosis Date  . DVT (deep venous thrombosis) (HCC)   . Generalized headaches   . Gout     Patient Active Problem List   Diagnosis Date Noted  . Mass of head 01/21/2020  . Gout 01/21/2020    History reviewed. No pertinent surgical history.     History reviewed. No pertinent family history.  Social History   Tobacco Use  . Smoking status: Never Smoker  . Smokeless tobacco: Never Used  Substance Use Topics  . Alcohol use: No  . Drug use: No    Home Medications Prior to Admission medications   Medication Sig Start Date End Date Taking? Authorizing Provider  oxyCODONE (ROXICODONE) 5 MG immediate release tablet Take 1 tablet (5 mg total) by mouth every 6 (six) hours as needed for up to 16 doses for severe pain. 06/16/20  Yes Sabino Donovan, MD  acetaminophen (TYLENOL) 500 MG tablet Take 500-1,000 mg by mouth every 6 (six) hours as needed (for pain or headaches).    [provider]  colchicine 0.6 MG tablet Take 1 tablet (0.6 mg total) by mouth 2 (two) times daily as needed. 01/21/20   [name], MD  ibuprofen (ADVIL,MOTRIN) 200 MG tablet Take 400 mg by mouth daily as needed for mild pain.    [provider]  predniSONE (DELTASONE) 20 MG tablet Take 2 tablets (40 mg total) by mouth daily with breakfast. 02/10/20   Bing Neighbors, FNP  Pseudoeph-Doxylamine-DM-APAP (NYQUIL PO) Take 2 tablets by mouth daily as needed (cold  symptoms).     [provider]  Pseudoephedrine-APAP-DM (DAYQUIL PO) Take 2 tablets by mouth daily as needed (cold symptoms).    [provider]  tiZANidine (ZANAFLEX) 4 MG tablet Take 1 tablet (4 mg total) by mouth at bedtime as needed for muscle spasms. 02/10/20   Bing Neighbors, FNP    Allergies    Bee venom  Review of Systems   Review of Systems  Constitutional: Negative for chills and fever.  HENT: Negative for congestion and rhinorrhea.   Respiratory: Negative for cough and shortness of breath.   Cardiovascular: Positive for chest pain. Negative for palpitations.  Gastrointestinal: Negative for diarrhea, nausea and vomiting.  Genitourinary: Negative for difficulty urinating and dysuria.  Musculoskeletal: Negative for arthralgias and back pain.  Skin: Negative for color change and rash.  Neurological: Negative for light-headedness and headaches.    Physical Exam Updated Vital Signs BP (!) 154/110   Pulse 79   Temp 98.3 F (36.8 C)   Resp 18   SpO2 96%   Physical Exam Vitals and nursing note reviewed. Exam conducted with a chaperone present.  Constitutional:      General: He is not in acute distress.    Appearance: Normal appearance.  HENT:     Head: Normocephalic and atraumatic.     Nose: No rhinorrhea.  Eyes:  General:        Right eye: No discharge.        Left eye: No discharge.     Conjunctiva/sclera: Conjunctivae normal.  Cardiovascular:     Rate and Rhythm: Normal rate and regular rhythm.  Pulmonary:     Effort: Pulmonary effort is normal.     Breath sounds: No stridor. No wheezing or rales.  Chest:     Chest wall: Tenderness (left lateral) present.  Abdominal:     General: Abdomen is flat. There is no distension.     Palpations: Abdomen is soft.     Tenderness: There is no abdominal tenderness. There is no guarding.  Musculoskeletal:        General: No deformity or signs of injury.  Skin:    General: Skin is warm and dry.   Neurological:     General: No focal deficit present.     Mental Status: He is alert. Mental status is at baseline.     Motor: No weakness.  Psychiatric:        Mood and Affect: Mood normal.        Behavior: Behavior normal.        Thought Content: Thought content normal.     ED Results / Procedures / Treatments   Labs (all labs ordered are listed, but only abnormal results are displayed) Labs Reviewed - No data to display  EKG None  Radiology DG Ribs Unilateral W/Chest Left  Result Date: 06/16/2020 CLINICAL DATA:  fall/ rib fx ?, fell off a roof 5 days ago EXAM: LEFT RIBS AND CHEST - 3+ VIEW COMPARISON:  Chest radiograph 01/27/2019, chest CT 04/18/2018 FINDINGS: There is a possible nondisplaced left anterior eighth rib fracture. There is no evidence of pneumothorax or pleural effusion. Both lungs are clear. Heart size and mediastinal contours are within normal limits. IMPRESSION: Possible nondisplaced left anterior eighth rib fracture, correlate with point tenderness. Electronically Signed   By: Caprice Renshaw   On: 06/16/2020 08:52    Procedures Procedures   Medications Ordered in ED Medications  oxyCODONE-acetaminophen (PERCOCET/ROXICET) 5-325 MG per tablet 1 tablet (1 tablet Oral Given 06/16/20 8366)    ED Course  I have reviewed the triage vital signs and the nursing notes.  Pertinent labs & imaging results that were available during my care of the patient were reviewed by me and considered in my medical decision making (see chart for details).    MDM Rules/Calculators/A&P                          Fall, point tenderness in the lateral left lower ribs.  X-ray reviewed by radiology myself shows possible eighth rib fracture.  Patient is breathing comfortably has normal vitals.  He is given incentive spirometry and pain control.  He needs incentive spirometry to help keep his lungs expanded in the setting of rib fracture.  He understands as he is given pain control and  discharge instructions with return precautions Final Clinical Impression(s) / ED Diagnoses Final diagnoses:  Fracture of one rib, left side, initial encounter for closed fracture    Rx / DC Orders ED Discharge Orders         Ordered    oxyCODONE (ROXICODONE) 5 MG immediate release tablet  Every 6 hours PRN        06/16/20 0933           Sabino Donovan, MD 06/16/20 [phone]

## 2020-11-03 ENCOUNTER — Other Ambulatory Visit: Payer: Self-pay | Admitting: Otolaryngology

## 2020-11-03 ENCOUNTER — Other Ambulatory Visit (HOSPITAL_COMMUNITY): Payer: Self-pay | Admitting: Otolaryngology

## 2020-11-03 DIAGNOSIS — L989 Disorder of the skin and subcutaneous tissue, unspecified: Secondary | ICD-10-CM

## 2020-11-04 ENCOUNTER — Ambulatory Visit
Admission: RE | Admit: 2020-11-04 | Discharge: 2020-11-04 | Disposition: A | Discharge status: Home / Self Care | Payer: Self-pay | Source: Ambulatory Visit | Attending: Otolaryngology | Admitting: Otolaryngology

## 2020-11-04 ENCOUNTER — Other Ambulatory Visit: Payer: Self-pay

## 2020-11-04 DIAGNOSIS — L989 Disorder of the skin and subcutaneous tissue, unspecified: Secondary | ICD-10-CM | POA: Insufficient documentation

## 2020-11-07 ENCOUNTER — Ambulatory Visit (HOSPITAL_COMMUNITY)
Admission: EM | Admit: 2020-11-07 | Discharge: 2020-11-07 | Disposition: A | Discharge status: Home / Self Care | Payer: Self-pay | Attending: Urgent Care | Admitting: Urgent Care

## 2020-11-07 ENCOUNTER — Encounter (HOSPITAL_COMMUNITY): Payer: Self-pay

## 2020-11-07 DIAGNOSIS — R030 Elevated blood-pressure reading, without diagnosis of hypertension: Secondary | ICD-10-CM

## 2020-11-07 DIAGNOSIS — M109 Gout, unspecified: Secondary | ICD-10-CM

## 2020-11-07 DIAGNOSIS — I10 Essential (primary) hypertension: Secondary | ICD-10-CM

## 2020-11-07 DIAGNOSIS — M2550 Pain in unspecified joint: Secondary | ICD-10-CM

## 2020-11-07 MED ORDER — AMLODIPINE BESYLATE 5 MG PO TABS: 5.0000 mg | ORAL_TABLET | Freq: Every day | ORAL | 0 refills | Status: DC

## 2020-11-07 MED ORDER — PREDNISONE 20 MG PO TABS: ORAL_TABLET | ORAL | 0 refills | Status: DC

## 2020-11-07 MED ORDER — COLCHICINE 0.6 MG PO TABS: 0.6000 mg | ORAL_TABLET | Freq: Two times a day (BID) | ORAL | 1 refills | Status: DC

## 2020-11-07 NOTE — Discharge Instructions (Addendum)
Use prednisone now for inflammation of your joints and a possible gout attack. This is for 5 days only. Then you can start using colchicine in the future if the gout returns. Use 1 tablet twice daily of the colchicine until your pain starts.  Start amlodipine for your blood pressure. Follow up with a new primary care doctor to recheck your blood pressure. Call FNP [facility] for an appointment.

## 2020-11-07 NOTE — ED Provider Notes (Signed)
[facility]   MRN: [id number] DOB: 07-08-1970  Subjective:   Danny Kim is a 50 y.o. male presenting for concerns for recurrent gout of the right ankle, left great toe.  Patient states that he has a history of this and actually has multiple joint pains of the right shoulder, bilateral hips.  Denies falls, trauma.  He works as a Designer, fashion/clothing, works extensive hours, perform strenuous work.  Regarding his blood pressure, denies headache, chest pain, weakness, numbness or tingling, hematuria.  Used to get medication for this but was lost to follow-up.  No current facility-administered medications for this encounter.  Current Outpatient Medications:    acetaminophen (TYLENOL) 500 MG tablet, Take 500-1,000 mg by mouth every 6 (six) hours as needed (for pain or headaches)., Disp: , Rfl:    ibuprofen (ADVIL,MOTRIN) 200 MG tablet, Take 400 mg by mouth daily as needed for mild pain., Disp: , Rfl:    Allergies  Allergen Reactions   Bee Venom Anaphylaxis    Past Medical History:  Diagnosis Date   DVT (deep venous thrombosis) (HCC)    Generalized headaches    Gout      History reviewed. No pertinent surgical history.  History reviewed. No pertinent family history.  Social History   Tobacco Use   Smoking status: Never   Smokeless tobacco: Never  Substance Use Topics   Alcohol use: No   Drug use: No    ROS   Objective:   Vitals: BP (!) 175/109 (BP Location: Left Arm)   Pulse 85   Temp 97.9 F (36.6 C) (Oral)   Resp 18   SpO2 97%   BP Readings from Last 3 Encounters:  11/07/20 (!) 175/109  06/16/20 (!) 154/110  02/10/20 (!) 147/95   BP recheck was 154/109.   Physical Exam Constitutional:      General: He is not in acute distress.    Appearance: Normal appearance. He is well-developed and normal weight. He is not ill-appearing, toxic-appearing or diaphoretic.  HENT:     Head: Normocephalic and atraumatic.     Right Ear: External ear normal.     Left Ear:  External ear normal.     Nose: Nose normal.     Mouth/Throat:     Pharynx: Oropharynx is clear.  Eyes:     General: No scleral icterus.       Right eye: No discharge.        Left eye: No discharge.     Extraocular Movements: Extraocular movements intact.     Pupils: Pupils are equal, round, and reactive to light.  Cardiovascular:     Rate and Rhythm: Normal rate.  Pulmonary:     Effort: Pulmonary effort is normal.  Musculoskeletal:     Cervical back: Normal range of motion.       Legs:     Comments: Full range of motion throughout.  Movement pain of both bilateral hips, shoulders and ankles, feet.  Neurological:     Mental Status: He is alert and oriented to person, place, and time.  Psychiatric:        Mood and Affect: Mood normal.        Behavior: Behavior normal.        Thought Content: Thought content normal.        Judgment: Judgment normal.     Assessment and Plan :   PDMP not reviewed this encounter.  1. Acute gout involving toe of left foot, unspecified cause  2. Acute gout of right ankle, unspecified cause   3. Essential hypertension   4. Elevated blood pressure reading   5. Multiple joint pain    Discussed with patient and his family member that I did not see signs of gout but they were upset by this.  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  Offered him a prednisone course as this could address either problem.  I chose the end of the exam without performing a neurologic exam as his family member was getting upset about what we were discussing. Counseled patient on potential for adverse effects with medications prescribed/recommended today, ER and return-to-clinic precautions discussed, patient verbalized understanding.    Wallis Bamberg, [place] 11/07/20 [id number]

## 2020-11-07 NOTE — ED Triage Notes (Signed)
Pt c/o gout to rt foot x3 days and lt foot since last night. States taking tylenol and ibuprofen without relief.

## 2020-11-12 ENCOUNTER — Other Ambulatory Visit: Payer: Self-pay

## 2020-11-12 ENCOUNTER — Encounter: Payer: Self-pay | Admitting: Family Medicine

## 2020-11-12 ENCOUNTER — Ambulatory Visit (INDEPENDENT_AMBULATORY_CARE_PROVIDER_SITE_OTHER): Payer: Self-pay | Admitting: Family Medicine

## 2020-11-12 VITALS — BP 145/98 | HR 84 | Wt 175.0 lb

## 2020-11-12 DIAGNOSIS — M1A9XX0 Chronic gout, unspecified, without tophus (tophi): Secondary | ICD-10-CM

## 2020-11-12 DIAGNOSIS — M79671 Pain in right foot: Secondary | ICD-10-CM

## 2020-11-12 DIAGNOSIS — M25471 Effusion, right ankle: Secondary | ICD-10-CM

## 2020-11-12 DIAGNOSIS — M79672 Pain in left foot: Secondary | ICD-10-CM

## 2020-11-12 MED ORDER — KETOROLAC TROMETHAMINE 30 MG/ML IJ SOLN
30.0000 mg | Freq: Once | INTRAMUSCULAR | Status: AC
  Administered 2020-11-12: 30 mg via INTRAMUSCULAR

## 2020-11-12 NOTE — Patient Instructions (Addendum)
Please go to [facility] to have xrays of your feet done at [facility].   You do not need an appointment to have these xrays.   Please follow up for a lab appointment in 1 week to check your gout levels and other inflammatory levels to make sure there are no other underlying conditions causing your joint pain.

## 2020-11-12 NOTE — Progress Notes (Signed)
    SUBJECTIVE:   CHIEF COMPLAINT / HPI: foot pain   Patient reports pain 8 days ago. He localizes to right heel and left base of great toes. He reports some redenss in the left great toe. Danny Kim is 7./10 Wife reports that he was taking prednisone for 6 days. He denies recent injury to his feet. Wife is serving as Nurse, learning disability and says that he has been taking prednisone and has a RX for colchicine to start after the prednisone, starting in two days.   PERTINENT  PMH / PSH:  Gout   OBJECTIVE:   BP (!) 145/98   Pulse 84   Wt 175 lb (79.4 kg)   SpO2 98%   BMI 30.04 kg/m   General: male appearing stated age in no acute distress Cardio: Normal S1 and S2, no S3 or S4. Rhythm is regular. No murmurs or rubs.  Bilateral radial pulses palpable Pulm: Clear to auscultation bilaterally, no crackles, wheezing, or diminished breath sounds. Normal respiratory effort, stable on RA Abdomen: Bowel sounds normal. Abdomen soft and non-tender.  Extremities: Patient with tenderness over right Achilles, swelling is left great toe base, see media images Neuro: neurovascular intact in bilateral feet   ASSESSMENT/PLAN:   Gout Recommend obtaining uric acid levels at follow up visit, along with ANA,RA and CRP, future lab visit scheduled  Will obtain bilateral feet xrays today  Wife reports patient recently evaluated in urgent care and prescribed prednisone course  Will administer toradol for pain management here in office      Ronnald Ramp, MD [facility]

## 2020-11-13 ENCOUNTER — Ambulatory Visit
Admission: RE | Admit: 2020-11-13 | Discharge: 2020-11-13 | Disposition: A | Discharge status: Home / Self Care | Payer: Self-pay | Source: Ambulatory Visit | Attending: Family Medicine | Admitting: Family Medicine

## 2020-11-13 DIAGNOSIS — M79671 Pain in right foot: Secondary | ICD-10-CM

## 2020-11-18 ENCOUNTER — Other Ambulatory Visit: Payer: Self-pay

## 2020-11-18 ENCOUNTER — Encounter: Payer: Self-pay | Admitting: Family Medicine

## 2020-11-18 DIAGNOSIS — M25471 Effusion, right ankle: Secondary | ICD-10-CM

## 2020-11-18 NOTE — Assessment & Plan Note (Signed)
Recommend obtaining uric acid levels at follow up visit, along with ANA,RA and CRP, future lab visit scheduled  Will obtain bilateral feet xrays today  Wife reports patient recently evaluated in urgent care and prescribed prednisone course  Will administer toradol for pain management here in office

## 2020-11-19 LAB — RHEUMATOID FACTOR: Rheumatoid fact SerPl-aCnc: <10 IU/mL (ref <14.0)

## 2020-11-19 LAB — URIC ACID: Uric Acid: 6 mg/dL (ref 3.8–8.4)

## 2020-11-19 LAB — C-REACTIVE PROTEIN: CRP: 2 mg/L (ref 0–10)

## 2020-11-19 LAB — ANA: Anti Nuclear Antibody (ANA): NEGATIVE

## 2021-01-05 ENCOUNTER — Other Ambulatory Visit: Payer: Self-pay | Admitting: Otolaryngology

## 2021-01-08 LAB — SURGICAL PATHOLOGY

## 2021-04-23 NOTE — Patient Instructions (Addendum)
It was nice seeing you today! ? ?Try doing neck exercises at home most days of the week. ? ?Recommend getting a blood pressure cuff and keeping a log of your blood pressure readings. ? ?Let me know if you are getting frequent readings over 140/90. ? ?Stay well, ?Zola Button, MD ?[facility] ?[phone] ? ?-- ? ?Make sure to check out at the front desk before you leave today. ? ?Please arrive at least 15 minutes prior to your scheduled appointments. ? ?If you had blood work today, I will send you a MyChart message or a letter if results are normal. Otherwise, I will give you a call. ? ?If you had a referral placed, they will call you to set up an appointment. Please give [place] a call if you don't hear back in the next 2 weeks. ? ?If you need additional refills before your next appointment, please call your pharmacy first.  ?

## 2021-04-23 NOTE — Progress Notes (Signed)
? ? ?  SUBJECTIVE:  ? ?CHIEF COMPLAINT / HPI:  ?Chief Complaint  ?Patient presents with  ? Hypertension  ? Medication Refill  ?  ?HTN ?Started on amlodipine 5 mg at urgent care October 2022 when he was seen for gout flare. ?He ran out of his medications 3 weeks ago. ?He does not have a BP at home. ?He is walking on occasion. ? ?Neck pain ?Reports ongoing neck pain for the past month which radiates down his shoulders bilaterally into his arms. Denies numbness, tingling, weakness. Taking Tylenol and ibuprofen as needed without significant relief. Pain is slowly improving. ? ?He still works in [employer]. ? ?PERTINENT  PMH / PSH: HTN ? ?Patient Care Team: ?[name], MD as PCP - General (Family Medicine)  ? ?OBJECTIVE:  ? ?BP (!) 143/98   Pulse 68   Ht 5' 4.5" (1.638 m)   Wt 183 lb (83 kg)   SpO2 98%   BMI 30.93 kg/m?   ?Physical Exam ?Constitutional:   ?   General: He is not in acute distress. ?HENT:  ?   Head: Normocephalic and atraumatic.  ?Neck:  ?   Comments: No midline spinous tenderness. There is mild paraspinal and trapezius tenderness bilaterally with palpation. FROM without pain. Negative Spurling's. ?Cardiovascular:  ?   Rate and Rhythm: Normal rate and regular rhythm.  ?   Heart sounds: Normal heart sounds. No murmur heard. ?Pulmonary:  ?   Effort: Pulmonary effort is normal. No respiratory distress.  ?   Breath sounds: Normal breath sounds.  ?Musculoskeletal:  ?   Cervical back: Normal range of motion and neck supple.  ?Skin: ?   General: Skin is warm and dry.  ?Neurological:  ?   Mental Status: He is alert.  ?   Motor: No weakness.  ?   Deep Tendon Reflexes:  ?   Reflex Scores: ?     Bicep reflexes are 2+ on the right side and 2+ on the left side. ?     Brachioradialis reflexes are 2+ on the right side and 2+ on the left side. ?   Comments: Full strength in bilateral upper extremities.  ?  ? ? ?  04/29/2021  ? 10:40 AM  ?Depression screen PHQ 2/9  ?Decreased Interest 0  ?Down, Depressed, Hopeless 0   ?PHQ - 2 Score 0  ?Altered sleeping 3  ?Tired, decreased energy 2  ?Change in appetite 0  ?Feeling bad or failure about yourself  0  ?Trouble concentrating 0  ?Moving slowly or fidgety/restless 0  ?Suicidal thoughts 0  ?PHQ-9 Score 5  ?  ? ?{Show previous vital signs (optional):23777} ? ? ? ?ASSESSMENT/PLAN:  ? ?Primary hypertension ?Recently diagnosed. Mildly elevated in office secondary to running out of medication. Amlodipine 5 mg refilled. Advised to obtain BP cuff and monitor at home. Lifestyle counseling provided. Will check BMP today. ? ?Mixed hyperlipidemia ?Repeat lipid panel today ?  ?Neck pain ?Likely muscular in nature given occupation. This is improving. No evidence of nerve impingement, neuro exam nl. ?- continue acetaminophen/ibuprofen prn ?- neck exercises ?- consider PT referral, imaging if not improving ? ?Return in about 6 months (around 10/29/2021) for f/u HTN.  ? ?[name], MD ?[facility]  ?

## 2021-04-29 ENCOUNTER — Ambulatory Visit (INDEPENDENT_AMBULATORY_CARE_PROVIDER_SITE_OTHER): Payer: Self-pay | Admitting: Family Medicine

## 2021-04-29 ENCOUNTER — Encounter: Payer: Self-pay | Admitting: Family Medicine

## 2021-04-29 VITALS — BP 143/98 | HR 68 | Ht 64.5 in | Wt 183.0 lb

## 2021-04-29 DIAGNOSIS — E782 Mixed hyperlipidemia: Secondary | ICD-10-CM

## 2021-04-29 DIAGNOSIS — I10 Essential (primary) hypertension: Secondary | ICD-10-CM

## 2021-04-29 DIAGNOSIS — M542 Cervicalgia: Secondary | ICD-10-CM

## 2021-04-29 MED ORDER — AMLODIPINE BESYLATE 5 MG PO TABS: 5.0000 mg | ORAL_TABLET | Freq: Every day | ORAL | 1 refills | Status: DC

## 2021-04-29 NOTE — Assessment & Plan Note (Addendum)
Recently diagnosed. Mildly elevated in office secondary to running out of medication. Amlodipine 5 mg refilled. Advised to obtain BP cuff and monitor at home. Lifestyle counseling provided. Will check BMP today. ?

## 2021-04-29 NOTE — Assessment & Plan Note (Signed)
Repeat lipid panel today. 

## 2021-04-30 LAB — BASIC METABOLIC PANEL
BUN/Creatinine Ratio: 17 (ref 9–20)
BUN: 17 mg/dL (ref 6–24)
CO2: 25 mmol/L (ref 20–29)
Calcium: 9.8 mg/dL (ref 8.7–10.2)
Chloride: 101 mmol/L (ref 96–106)
Creatinine, Ser: 1 mg/dL (ref 0.76–1.27)
Glucose: 84 mg/dL (ref 70–99)
Potassium: 4.7 mmol/L (ref 3.5–5.2)
Sodium: 144 mmol/L (ref 134–144)
eGFR: 91 mL/min/{1.73_m2} (ref >59)

## 2021-04-30 LAB — LIPID PANEL
Chol/HDL Ratio: 6.6 ratio — ABNORMAL HIGH (ref 0.0–5.0)
Cholesterol, Total: 244 mg/dL — ABNORMAL HIGH (ref 100–199)
HDL: 37 mg/dL — ABNORMAL LOW (ref >39)
LDL Chol Calc (NIH): 142 mg/dL — ABNORMAL HIGH (ref 0–99)
Triglycerides: 355 mg/dL — ABNORMAL HIGH (ref 0–149)
VLDL Cholesterol Cal: 65 mg/dL — ABNORMAL HIGH (ref 5–40)

## 2021-05-11 ENCOUNTER — Telehealth: Payer: Self-pay

## 2021-05-11 MED ORDER — AMLODIPINE BESYLATE 5 MG PO TABS: 5.0000 mg | ORAL_TABLET | Freq: Every day | ORAL | 1 refills | Status: DC

## 2021-05-11 NOTE — Telephone Encounter (Signed)
Wife calls nurse line reporting they have not been able to pick up amlodipine from [facility].  ? ?I called the pharmacy and prescription was never received, even though receipt was confirmed. ? ?Medication resent.  ? ?Wife denies any headaches, vision changes, chest pains or SOB.  ? ? ?

## 2021-06-23 ENCOUNTER — Encounter (HOSPITAL_COMMUNITY): Payer: Self-pay

## 2021-06-23 ENCOUNTER — Emergency Department (HOSPITAL_COMMUNITY): Payer: No Typology Code available for payment source

## 2021-06-23 ENCOUNTER — Emergency Department (HOSPITAL_COMMUNITY)
Admission: EM | Admit: 2021-06-23 | Discharge: 2021-06-23 | Disposition: A | Discharge status: Home / Self Care | Payer: No Typology Code available for payment source | Attending: Emergency Medicine | Admitting: Emergency Medicine

## 2021-06-23 ENCOUNTER — Other Ambulatory Visit: Payer: Self-pay

## 2021-06-23 ENCOUNTER — Emergency Department (HOSPITAL_BASED_OUTPATIENT_CLINIC_OR_DEPARTMENT_OTHER): Payer: No Typology Code available for payment source

## 2021-06-23 DIAGNOSIS — M79605 Pain in left leg: Secondary | ICD-10-CM

## 2021-06-23 DIAGNOSIS — R072 Precordial pain: Secondary | ICD-10-CM | POA: Insufficient documentation

## 2021-06-23 DIAGNOSIS — M79662 Pain in left lower leg: Secondary | ICD-10-CM

## 2021-06-23 LAB — CBC WITH DIFFERENTIAL/PLATELET
Abs Immature Granulocytes: 0.02 10*3/uL (ref 0.00–0.07)
Basophils Absolute: 0.1 10*3/uL (ref 0.0–0.1)
Basophils Relative: 1 %
Eosinophils Absolute: 0.4 10*3/uL (ref 0.0–0.5)
Eosinophils Relative: 7 %
HCT: 44.5 % (ref 39.0–52.0)
Hemoglobin: 14.1 g/dL (ref 13.0–17.0)
Immature Granulocytes: 0 %
Lymphocytes Relative: 26 %
Lymphs Abs: 1.3 10*3/uL (ref 0.7–4.0)
MCH: 22.5 pg — ABNORMAL LOW (ref 26.0–34.0)
MCHC: 31.7 g/dL (ref 30.0–36.0)
MCV: 71.1 fL — ABNORMAL LOW (ref 80.0–100.0)
Monocytes Absolute: 0.5 10*3/uL (ref 0.1–1.0)
Monocytes Relative: 11 %
Neutro Abs: 2.7 10*3/uL (ref 1.7–7.7)
Neutrophils Relative %: 55 %
Platelets: 277 10*3/uL (ref 150–400)
RBC: 6.26 MIL/uL — ABNORMAL HIGH (ref 4.22–5.81)
RDW: 13.8 % (ref 11.5–15.5)
WBC: 5 10*3/uL (ref 4.0–10.5)
nRBC: 0 % (ref 0.0–0.2)

## 2021-06-23 LAB — BASIC METABOLIC PANEL
Anion gap: 10 (ref 5–15)
BUN: 13 mg/dL (ref 6–20)
CO2: 26 mmol/L (ref 22–32)
Calcium: 9.4 mg/dL (ref 8.9–10.3)
Chloride: 103 mmol/L (ref 98–111)
Creatinine, Ser: 0.9 mg/dL (ref 0.61–1.24)
GFR, Estimated: >60 mL/min (ref >60)
Glucose, Bld: 83 mg/dL (ref 70–99)
Potassium: 3.6 mmol/L (ref 3.5–5.1)
Sodium: 139 mmol/L (ref 135–145)

## 2021-06-23 LAB — TROPONIN I (HIGH SENSITIVITY)
Troponin I (High Sensitivity): 5 ng/L (ref <18)
Troponin I (High Sensitivity): 5 ng/L (ref <18)

## 2021-06-23 MED ORDER — IOHEXOL 350 MG/ML SOLN
100.0000 mL | Freq: Once | INTRAVENOUS | Status: AC | PRN
  Administered 2021-06-23: 100 mL via INTRAVENOUS

## 2021-06-23 NOTE — ED Triage Notes (Addendum)
Pt arrived POV from home c/o left leg pain x2 weeks. Pt states the pain is gradually getting worse. Pt denies any swelling but states he has a hx of blood clots in the leg. Pt states it hurts from his hip all the way down to his thigh.

## 2021-06-23 NOTE — Discharge Instructions (Addendum)
It was a pleasure taking care of you today!   Your labs are overall unremarkable today.  Your ultrasound did not show any blood clot in the leg.  Your CT did not show any blood clot or pneumonia and the chest.  You may follow-up with your primary care provider as needed regarding today's ED visit.  Return to the ED if you are experiencing increasing/worsening leg pain, inability to walk, pain in the chest, trouble breathing, worsening symptoms.

## 2021-06-23 NOTE — Progress Notes (Signed)
LLE venous duplex has been completed.  Preliminary results messaged to [employer], PA-C via secure chat.  Results can be found under chart review under CV PROC. 06/23/2021 5:16 PM Alpheus Stiff RVT, RDMS

## 2021-06-23 NOTE — ED Provider Notes (Signed)
Care transferred from [facility], PA-C at time of sign out. See their note for full assessment.   Briefly: Patient is 51 y.o. male with history of DVT who presents to the ED with concerns for DVT to left leg due to calf pain onset 1-2 weeks.    Plan: Plan per previous PA-C: Pending imaging and labs.  Likely discharge home.  Clinical Course as of 06/23/21 1846  Tue Jun 23, 2021  1608 Notified that patient was negative for DVT at this time. [SB]  1810 Discussed with patient and family member regarding lab and imaging findings.  Discussed discharge treatment plan.  Answered all available questions.  Patient for safe for discharge at this time. [SB]    Clinical Course User Index [SB] Bama Hanselman A, PA-C     Doubt DVT at this time, ultrasound DVT study negative.  Doubt PE, CTA negative at this time for PE.  Doubt pneumonia.  Doubt ACS initial troponin and delta troponin stable at 5.  Doubt cellulitis, no overlying skin changes. Feel the patient can be safely discharged home with follow-up with primary care provider.  Supportive care and strict return precautions discussed with patient. Patient acknowledges and verbalizes understanding. Pt appears safe for discharge. Follow up as indicated in discharge paperwork.    This chart was dictated using voice recognition software, Dragon. Despite the best efforts of this provider to proofread and correct errors, errors may still occur which can change documentation meaning.    Jacere Pangborn A, PA-C 06/23/21 1847    Terrilee Files, MD 06/24/21 [phone]

## 2021-06-23 NOTE — ED Provider Notes (Signed)
[facility] EMERGENCY DEPARTMENT Provider Note   CSN: [id number] Arrival date & time: 06/23/21  1102     History  Chief Complaint  Patient presents with   Leg Pain    Danny Kim is a 51 y.o. male with previous history of DVT in the left leg presents to the ED for evaluation of left lower extremity pain that started about 1 to 2 weeks ago and has been gradually worsening since onset.  Pain is primarily in the posterior calf and seems to be radiating up around to the lateral hip.  He notes that pain feels similar to previous DVT.  He is not currently on blood thinners.  No known trauma or injury.  He is also chest discomfort substernally described as "heavy" or as if there is something stuck in it.  It is worse when bending forward.  He denies shortness of breath, abdominal pain, nausea, vomiting and diarrhea.  Leg Pain     Home Medications Prior to Admission medications   Medication Sig Start Date End Date Taking? Authorizing Provider  acetaminophen (TYLENOL) 500 MG tablet Take 500-1,000 mg by mouth every 6 (six) hours as needed (for pain or headaches).    [provider]  amLODipine (NORVASC) 5 MG tablet Take 1 tablet (5 mg total) by mouth daily. 05/11/21   [name], MD  colchicine 0.6 MG tablet Take 1 tablet (0.6 mg total) by mouth 2 (two) times daily. 11/07/20   Wallis Bamberg, PA-C  ibuprofen (ADVIL,MOTRIN) 200 MG tablet Take 400 mg by mouth daily as needed for mild pain.    [provider]  multivitamin-iron-minerals-folic acid (CENTRUM) chewable tablet Chew 1 tablet by mouth daily.    [provider]      Allergies    Bee venom    Review of Systems   Review of Systems  Physical Exam Updated Vital Signs BP (!) 136/93 (BP Location: Right Arm)   Pulse 73   Temp 98.5 F (36.9 C) (Oral)   Resp 14   Ht 5\' 4"  (1.626 m)   Wt 74.8 kg   SpO2 97%   BMI 28.32 kg/m  Physical Exam Vitals and nursing note reviewed.  Constitutional:       General: He is not in acute distress.    Appearance: He is not ill-appearing.  HENT:     Head: Atraumatic.  Eyes:     Conjunctiva/sclera: Conjunctivae normal.  Cardiovascular:     Rate and Rhythm: Normal rate and regular rhythm.     Pulses: Normal pulses.     Heart sounds: No murmur heard. Pulmonary:     Effort: Pulmonary effort is normal. No respiratory distress.     Breath sounds: Normal breath sounds.  Abdominal:     General: Abdomen is flat. There is no distension.     Palpations: Abdomen is soft.     Tenderness: There is no abdominal tenderness.  Musculoskeletal:        General: Normal range of motion.     Cervical back: Normal range of motion.     Comments: Bilateral lower extremities without edema.  Left posterior calf tenderness extending up the posterior thigh.  +2 DP pulses.  Full range of motion.  No obvious deformities, bruising  Skin:    General: Skin is warm and dry.     Capillary Refill: Capillary refill takes less than 2 seconds.  Neurological:     General: No focal deficit present.     Mental Status:  He is alert.  Psychiatric:        Mood and Affect: Mood normal.    ED Results / Procedures / Treatments   Labs (all labs ordered are listed, but only abnormal results are displayed) Labs Reviewed  BASIC METABOLIC PANEL  CBC WITH DIFFERENTIAL/PLATELET  TROPONIN I (HIGH SENSITIVITY)    EKG None  Radiology No results found.  Procedures Procedures    Medications Ordered in ED Medications - No data to display  ED Course/ Medical Decision Making/ A&P                           Medical Decision Making Amount and/or Complexity of Data Reviewed Labs: ordered. Radiology: ordered.   51 year old male presents to the ED for evaluation of left lower extremity pain and midsternal chest discomfort that started about 2 weeks ago.  Differentials include DVT, PE, ACS, cellulitis.  Vitals without significant abnormality.  He is oxygenating at 97% on room air  with a pulse of 73.  On physical exam, his left lower extremity is without edema or skin discoloration, however he is tender to palpation of the posterior calf.  He is otherwise neurovascularly intact.  Lungs CTA bilaterally.  I ordered ultrasound of the left lower extremity along with CTA, EKG and labs.  Patient care handed over to blue PA-C at shift change.  Dispo pending return of labs and imaging. Final Clinical Impression(s) / ED Diagnoses Final diagnoses:  Pain of left calf    Rx / DC Orders ED Discharge Orders     None         Janell Quiet, PA-C 06/23/21 1459    Gloris Manchester, MD 06/28/21 1512

## 2021-06-30 ENCOUNTER — Encounter: Payer: Self-pay | Admitting: *Deleted

## 2021-07-21 ENCOUNTER — Ambulatory Visit (INDEPENDENT_AMBULATORY_CARE_PROVIDER_SITE_OTHER): Payer: Self-pay | Admitting: Family Medicine

## 2021-07-21 VITALS — BP 132/78 | HR 70 | Wt 180.0 lb

## 2021-07-21 DIAGNOSIS — M1A9XX0 Chronic gout, unspecified, without tophus (tophi): Secondary | ICD-10-CM

## 2021-07-21 DIAGNOSIS — M10471 Other secondary gout, right ankle and foot: Secondary | ICD-10-CM

## 2021-07-21 MED ORDER — COLCHICINE 0.6 MG PO TABS: 0.6000 mg | ORAL_TABLET | Freq: Two times a day (BID) | ORAL | 11 refills | Status: DC

## 2021-07-21 MED ORDER — PREDNISONE 10 MG PO TABS: 30.0000 mg | ORAL_TABLET | Freq: Every day | ORAL | 0 refills | Status: AC

## 2021-07-21 NOTE — Progress Notes (Signed)
    SUBJECTIVE:   CHIEF COMPLAINT / HPI:   R toe pain: patient woke up with red hot great toe of right foot. He has a history of gout and previously was on colchicine 0.6 mg BID, but has run out of this medication. This weekend he ate sea food. The pain is significant enough that it makes it hard to walk. No fevers, chills, pustulant drainage, cough, swelling of the leg. He reports that he has had gout many times before and has gotten an injection in his toe that has helped.  On chart review I could not find evidence of which injection this would be.  [place] interpreter used throughout visit  PERTINENT  PMH / PSH: gout, h/o DVT  OBJECTIVE:   BP 132/78   Pulse 70   Wt 180 lb (81.6 kg)   SpO2 98%   BMI 30.90 kg/m   Nursing note and vitals reviewed GEN: age-appropriate, east Asian man, resting comfortably in chair, NAD, class I obesity Ext: great toe of R foot edematous, erythematous, tender to touch around MTP, ROM intact; no swelling or erythema above R MTP Psych: Pleasant and appropriate  ASSESSMENT/PLAN:   Gout 51 year old man, class I obesity, dietary indiscretions, history of uric acid above 6, now presenting with 1 day of podagra.  Gout calculator score 13, high for likely gout.  Also considered septic arthritis, however for more likely diagnosis is gout.  We discussed return precautions in case signs of infection spreads such as cellulitis, fever.  Will treat with prednisone 30 mg daily and recommend continuing for 48 hours past clearance of pain and redness.  Recommend patient then restart colchicine 0.6 mg.  Recommend he follow-up with his primary care physician to discuss titration of colchicine and measurement of uric acid to achieve optimal gout treatment.  Patient expressed understanding.  Discussed low purine diet extensively and list given of foods to avoid.     Shirlean Mylar, MD [facility]

## 2021-07-23 NOTE — Assessment & Plan Note (Signed)
51 year old man, class I obesity, dietary indiscretions, history of uric acid above 6, now presenting with 1 day of podagra.  Gout calculator score 13, high for likely gout.  Also considered septic arthritis, however for more likely diagnosis is gout.  We discussed return precautions in case signs of infection spreads such as cellulitis, fever.  Will treat with prednisone 30 mg daily and recommend continuing for 48 hours past clearance of pain and redness.  Recommend patient then restart colchicine 0.6 mg.  Recommend he follow-up with his primary care physician to discuss titration of colchicine and measurement of uric acid to achieve optimal gout treatment.  Patient expressed understanding.  Discussed low purine diet extensively and list given of foods to avoid.

## 2021-07-25 ENCOUNTER — Ambulatory Visit (HOSPITAL_COMMUNITY)
Admission: EM | Admit: 2021-07-25 | Discharge: 2021-07-25 | Disposition: A | Discharge status: Home / Self Care | Payer: No Typology Code available for payment source | Attending: Student | Admitting: Student

## 2021-07-25 ENCOUNTER — Encounter (HOSPITAL_COMMUNITY): Payer: Self-pay

## 2021-07-25 DIAGNOSIS — M1A9XX0 Chronic gout, unspecified, without tophus (tophi): Secondary | ICD-10-CM

## 2021-07-25 DIAGNOSIS — M109 Gout, unspecified: Secondary | ICD-10-CM

## 2021-07-25 MED ORDER — KETOROLAC TROMETHAMINE 30 MG/ML IJ SOLN
30.0000 mg | Freq: Once | INTRAMUSCULAR | Status: AC
  Administered 2021-07-25: 30 mg via INTRAMUSCULAR

## 2021-07-25 MED ORDER — COLCHICINE 0.6 MG PO TABS: 0.6000 mg | ORAL_TABLET | Freq: Every day | ORAL | 0 refills | Status: DC

## 2021-07-25 MED ORDER — AMLODIPINE BESYLATE 5 MG PO TABS: 5.0000 mg | ORAL_TABLET | Freq: Every day | ORAL | 2 refills | Status: DC

## 2021-07-25 MED ORDER — KETOROLAC TROMETHAMINE 30 MG/ML IJ SOLN
INTRAMUSCULAR | Status: AC
  Filled 2021-07-25: qty 1

## 2021-07-25 NOTE — ED Triage Notes (Signed)
Pt reports gout in his right foot. He was given medication, but it has not help.

## 2021-07-25 NOTE — Discharge Instructions (Addendum)
-  Colchicine once daily while symptoms persist  -Avoid ibuprofen for the next 24 hours since we gave you a shot today -Avoid beer, meat, seafood  -Complete the prednisone as directed -Continue the amlodipine once daily -Please check your blood pressure at home or at the pharmacy. If this continues to be >140/90, follow-up with your primary care provider for further blood pressure management/ medication titration. If you develop chest pain, shortness of breath, vision changes, the worst headache of your life- head straight to the ED or call 911. -Follow-up with PCP if symptoms persist -Head to the ED if new symptoms like pain travelling up your leg, calf sweling, shortness of breath, new fevers

## 2021-07-25 NOTE — ED Provider Notes (Signed)
[facility]    CSN: [id number] Arrival date & time: 07/25/21  1313      History   Chief Complaint Chief Complaint  Patient presents with   Gout    HPI Danny Kim is a 51 y.o. male presenting with gout R great toe. History DVT, gout, hypertension.  Gout was previously controlled on colchicine, has not taken this in several months.  He actually saw his primary care provider on 07/21/2021 for this gout, they prescribed prednisone which he has been taking without relief.  The current symptoms started after seafood.  He states that the pain is localized over the right great MTP joint, without radiation to the calf.  Denies unilateral leg swelling, shortness of breath, chest pain. Denies trauma. Symptoms are consistent with his typical gout. Also requesting refill on his amlodipine, he has been out of this for several days.   HPI  Past Medical History:  Diagnosis Date   DVT (deep venous thrombosis) (HCC)    Generalized headaches    Gout     Patient Active Problem List   Diagnosis Date Noted   Primary hypertension 04/29/2021   Mixed hyperlipidemia 04/29/2021   Mass of head 01/21/2020   Gout 01/21/2020    History reviewed. No pertinent surgical history.     Home Medications    Prior to Admission medications   Medication Sig Start Date End Date Taking? Authorizing Provider  amLODipine (NORVASC) 5 MG tablet Take 1 tablet (5 mg total) by mouth daily. 07/25/21  Yes Rhys Martini, PA-C  colchicine 0.6 MG tablet Take 1 tablet (0.6 mg total) by mouth daily for 7 days. 07/25/21 08/01/21 Yes Rhys Martini, PA-C  acetaminophen (TYLENOL) 500 MG tablet Take 500-1,000 mg by mouth every 6 (six) hours as needed (for pain or headaches).    [provider]  ibuprofen (ADVIL,MOTRIN) 200 MG tablet Take 400 mg by mouth daily as needed for mild pain.    [provider]  multivitamin-iron-minerals-folic acid (CENTRUM) chewable tablet Chew 1 tablet by mouth daily.     [provider]  predniSONE (DELTASONE) 10 MG tablet Take 3 tablets (30 mg total) by mouth daily with breakfast for 7 days. 07/21/21 07/28/21  Shirlean Mylar, MD    Family History History reviewed. No pertinent family history.  Social History Social History   Tobacco Use   Smoking status: Never    Passive exposure: Never   Smokeless tobacco: Never  Substance Use Topics   Alcohol use: No   Drug use: No     Allergies   Bee venom   Review of Systems Review of Systems  Musculoskeletal:        R great toe pain   All other systems reviewed and are negative.    Physical Exam Triage Vital Signs ED Triage Vitals [07/25/21 1446]  Enc Vitals Group     BP (!) 156/107     Pulse Rate (!) 56     Resp 16     Temp (!) 97.5 F (36.4 C)     Temp Source Oral     SpO2 96 %     Weight      Height      Head Circumference      Peak Flow      Pain Score 10     Pain Loc      Pain Edu?      Excl. in GC?    No data found.  Updated Vital Signs  BP (!) 156/107 (BP Location: Left Arm)   Pulse (!) 56   Temp (!) 97.5 F (36.4 C) (Oral)   Resp 16   SpO2 96%   Visual Acuity Right Eye Distance:   Left Eye Distance:   Bilateral Distance:    Right Eye Near:   Left Eye Near:    Bilateral Near:     Physical Exam Vitals reviewed.  Constitutional:      General: He is not in acute distress.    Appearance: Normal appearance. He is not ill-appearing.  HENT:     Head: Normocephalic and atraumatic.  Pulmonary:     Effort: Pulmonary effort is normal.  Musculoskeletal:     Comments: R foot - no skin changes or swelling. TTP over the R 1st MTP joint, without palpable bony deformity. No midfoot or malleolar pain. DP 2+, cap refill <2 seconds. Ambulating with pain. No calf sweling or pedal edema.  Neurological:     General: No focal deficit present.     Mental Status: He is alert and oriented to person, place, and time.  Psychiatric:        Mood and Affect: Mood normal.         Behavior: Behavior normal.        Thought Content: Thought content normal.        Judgment: Judgment normal.      UC Treatments / Results  Labs (all labs ordered are listed, but only abnormal results are displayed) Labs Reviewed - No data to display  EKG   Radiology No results found.  Procedures Procedures (including critical care time)  Medications Ordered in UC Medications  ketorolac (TORADOL) 30 MG/ML injection 30 mg (has no administration in time range)    Initial Impression / Assessment and Plan / UC Course  I have reviewed the triage vital signs and the nursing notes.  Pertinent labs & imaging results that were available during my care of the patient were reviewed by me and considered in my medical decision making (see chart for details).     This patient is a very pleasant 51 y.o. year old male presenting with gout R great toe. Neurovascularly intact, no trauma.  He does have a history of a DVT, as well as a long history of gout.  Symptoms are localized to the right first MTP joint and consistent with typical gout.  There is no unilateral leg swelling or calf swelling.  Current symptoms started after seafood.  He is on day 4 of prednisone p.o. with minimal relief.  We will send a short course of colchicine, complete the prednisone as directed.  He has not taken any NSAIDs today, so IM Toradol administered as well.  Kidney function within normal limits last BMP.  Continue low purine diet.  He is in agreement.  Patient and family member verbalized understanding and agreement, family member was present throughout the entire visit.  I also refilled his amlodipine 5 mg daily today.  He has been out of this for approximately 3 days.  Denies current chest pain, headaches, dizziness, vision changes.  Monitor blood pressure at home.  Final Clinical Impressions(s) / UC Diagnoses   Final diagnoses:  Gouty arthritis of right great toe     Discharge Instructions       -Colchicine once daily while symptoms persist  -Avoid ibuprofen for the next 24 hours since we gave you a shot today -Avoid beer, meat, seafood  -Complete the prednisone as directed -Continue the amlodipine  once daily -Please check your blood pressure at home or at the pharmacy. If this continues to be >140/90, follow-up with your primary care provider for further blood pressure management/ medication titration. If you develop chest pain, shortness of breath, vision changes, the worst headache of your life- head straight to the ED or call 911. -Follow-up with PCP if symptoms persist -Head to the ED if new symptoms like pain travelling up your leg, calf sweling, shortness of breath, new fevers   ED Prescriptions     Medication Sig Dispense Auth. Provider   colchicine 0.6 MG tablet Take 1 tablet (0.6 mg total) by mouth daily for 7 days. 7 tablet Rhys Martini, PA-C   amLODipine (NORVASC) 5 MG tablet Take 1 tablet (5 mg total) by mouth daily. 30 tablet Rhys Martini, PA-C      PDMP not reviewed this encounter.   Rhys Martini, PA-C 07/25/21 1506

## 2021-07-28 ENCOUNTER — Other Ambulatory Visit: Payer: Self-pay

## 2021-07-28 ENCOUNTER — Encounter (HOSPITAL_COMMUNITY): Payer: Self-pay

## 2021-07-28 ENCOUNTER — Emergency Department (HOSPITAL_COMMUNITY): Payer: No Typology Code available for payment source

## 2021-07-28 ENCOUNTER — Emergency Department (HOSPITAL_COMMUNITY)
Admission: EM | Admit: 2021-07-28 | Discharge: 2021-07-29 | Disposition: A | Discharge status: Home / Self Care | Payer: No Typology Code available for payment source | Attending: Emergency Medicine | Admitting: Emergency Medicine

## 2021-07-28 DIAGNOSIS — M79671 Pain in right foot: Secondary | ICD-10-CM | POA: Insufficient documentation

## 2021-07-28 DIAGNOSIS — I10 Essential (primary) hypertension: Secondary | ICD-10-CM | POA: Insufficient documentation

## 2021-07-28 DIAGNOSIS — Z79899 Other long term (current) drug therapy: Secondary | ICD-10-CM | POA: Insufficient documentation

## 2021-07-28 HISTORY — DX: Essential (primary) hypertension: I10

## 2021-07-28 MED ORDER — KETOROLAC TROMETHAMINE 15 MG/ML IJ SOLN
30.0000 mg | Freq: Once | INTRAMUSCULAR | Status: AC
  Administered 2021-07-29: 30 mg via INTRAMUSCULAR
  Filled 2021-07-28: qty 2

## 2021-07-28 MED ORDER — HYDROCODONE-ACETAMINOPHEN 5-325 MG PO TABS
1.0000 | ORAL_TABLET | Freq: Once | ORAL | Status: AC
  Administered 2021-07-29: 1 via ORAL
  Filled 2021-07-28: qty 1

## 2021-07-28 NOTE — ED Provider Notes (Signed)
[facility] EMERGENCY DEPARTMENT Provider Note   CSN: [id number] Arrival date & time: 07/28/21  2211     History {Add pertinent medical, surgical, social history, OB history to HPI:1} Chief Complaint  Patient presents with   Foot Pain    Danny Kim is a 51 y.o. male.  HPI     This is a 51 year old male with a history of chronic gout who presents with right great toe pain.  Patient reports that he has been having issues and pain in his right great toe since 6/27.  Was seen and evaluated in family medicine clinic.  Was started on a prednisone Dosepak.  Was subsequently seen in urgent care and started back on colchicine.  He was previously controlled on colchicine.  He states he has had ongoing pain.  He has finished his prednisone.  He states that the pain now radiates from his right great toe into his right calf muscle.  He has a history of DVT but is not on any anticoagulants.  Denies any specific pain in the calf but does report the pain radiates into the calf.  Patient states he is having difficulty walking now secondary to pain.  No fevers.  No redness.  Chart reviewed confirming treatments listed above.  He was also seen at the end of May for calf pain and had a negative DVT study at that time.  Home Medications Prior to Admission medications   Medication Sig Start Date End Date Taking? Authorizing Provider  acetaminophen (TYLENOL) 500 MG tablet Take 500-1,000 mg by mouth every 6 (six) hours as needed (for pain or headaches).    [provider]  amLODipine (NORVASC) 5 MG tablet Take 1 tablet (5 mg total) by mouth daily. 07/25/21   Rhys Martini, PA-C  colchicine 0.6 MG tablet Take 1 tablet (0.6 mg total) by mouth daily for 7 days. 07/25/21 08/01/21  Rhys Martini, PA-C  ibuprofen (ADVIL,MOTRIN) 200 MG tablet Take 400 mg by mouth daily as needed for mild pain.    [provider]  multivitamin-iron-minerals-folic acid (CENTRUM) chewable tablet Chew 1  tablet by mouth daily.    [provider]  predniSONE (DELTASONE) 10 MG tablet Take 3 tablets (30 mg total) by mouth daily with breakfast for 7 days. 07/21/21 07/28/21  Shirlean Mylar, MD      Allergies    Bee venom    Review of Systems   Review of Systems  Constitutional:  Negative for fever.  Musculoskeletal:        Toe pain  All other systems reviewed and are negative.   Physical Exam Updated Vital Signs BP (!) 142/101 (BP Location: Left Arm)   Pulse 94   Temp 97.7 F (36.5 C) (Oral)   Resp 16   Ht 1.626 m (5\' 4" )   Wt 81.6 kg   SpO2 98%   BMI 30.90 kg/m  Physical Exam Vitals and nursing note reviewed.  Constitutional:      Appearance: He is well-developed. He is not ill-appearing.  HENT:     Head: Normocephalic and atraumatic.  Eyes:     Pupils: Pupils are equal, round, and reactive to light.  Cardiovascular:     Rate and Rhythm: Normal rate and regular rhythm.  Pulmonary:     Effort: Pulmonary effort is normal. No respiratory distress.  Abdominal:     Palpations: Abdomen is soft.     Tenderness: There is no abdominal tenderness.  Musculoskeletal:     Cervical  back: Neck supple.     Comments: Tenderness palpation right MTP, no overlying skin changes, no significant erythema, limited range of motion secondary to pain, 2+ DP pulse  Lymphadenopathy:     Cervical: No cervical adenopathy.  Skin:    General: Skin is warm and dry.  Neurological:     Mental Status: He is alert and oriented to person, place, and time.  Psychiatric:        Mood and Affect: Mood normal.     ED Results / Procedures / Treatments   Labs (all labs ordered are listed, but only abnormal results are displayed) Labs Reviewed - No data to display  EKG None  Radiology DG Foot Complete Right  Result Date: 07/28/2021 CLINICAL DATA:  Known right foot pain, history of gout, initial encounter EXAM: RIGHT FOOT COMPLETE - 3+ VIEW COMPARISON:  None Available. FINDINGS: No acute  fracture or dislocation is noted. Tarsal degenerative changes and calcaneal spurring is seen. No other focal abnormality is noted. IMPRESSION: Degenerative change without acute abnormality. Electronically Signed   By: Alcide Clever M.D.   On: 07/28/2021 22:50    Procedures Procedures  {Document cardiac monitor, telemetry assessment procedure when appropriate:1}  Medications Ordered in ED Medications  ketorolac (TORADOL) 15 MG/ML injection 30 mg (has no administration in time range)  HYDROcodone-acetaminophen (NORCO/VICODIN) 5-325 MG per tablet 1 tablet (has no administration in time range)    ED Course/ Medical Decision Making/ A&P                           Medical Decision Making Amount and/or Complexity of Data Reviewed Radiology: ordered.  Risk Prescription drug management.   ***  {Document critical care time when appropriate:1} {Document review of labs and clinical decision tools ie heart score, Chads2Vasc2 etc:1}  {Document your independent review of radiology images, and any outside records:1} {Document your discussion with family members, caretakers, and with consultants:1} {Document social determinants of health affecting pt's care:1} {Document your decision making why or why not admission, treatments were needed:1} Final Clinical Impression(s) / ED Diagnoses Final diagnoses:  None    Rx / DC Orders ED Discharge Orders     None

## 2021-07-28 NOTE — ED Triage Notes (Addendum)
Pt c/o gout in right foot, has been seen for same. Pt has had colchicine, prednisone and toradol w/o relief. Pain has worsened tonight, shooting into right calf, unable to walk on foot. Denies any other injury.  Pt has hx of DVT, is no longer on a blood thinner.

## 2021-07-29 ENCOUNTER — Ambulatory Visit (HOSPITAL_COMMUNITY): Payer: No Typology Code available for payment source

## 2021-07-29 MED ORDER — OXYCODONE-ACETAMINOPHEN 5-325 MG PO TABS: 1.0000 | ORAL_TABLET | Freq: Four times a day (QID) | ORAL | 0 refills | Status: DC | PRN

## 2021-07-29 NOTE — Discharge Instructions (Signed)
You were seen today for ongoing issues likely related to your gout.  Continue colchicine.  Take oxycodone as needed for breakthrough pain.  Do not drive while taking oxycodone.  Additionally, return later this morning for DVT study to ensure you do not have a blood clot in that leg as well.  Follow-up closely with your primary physician.

## 2021-10-20 IMAGING — CT CT HEAD W/O CM
3 of 4 series · 13 of 47 positions shown, 15 images · non-contrast
Comparison: None.

CLINICAL DATA: Right-sided scalp lesion.

EXAM:
CT HEAD WITHOUT CONTRAST
TECHNIQUE: Contiguous axial images were obtained from the base of the skull
through the vertex without intravenous contrast.

[Series 2: axial st head 5.00 ax · axial · 0.33mm/px · z∈[-533,-424]mm · 7 of 30 slices shown, 9 images]
[im 4/30  brain]
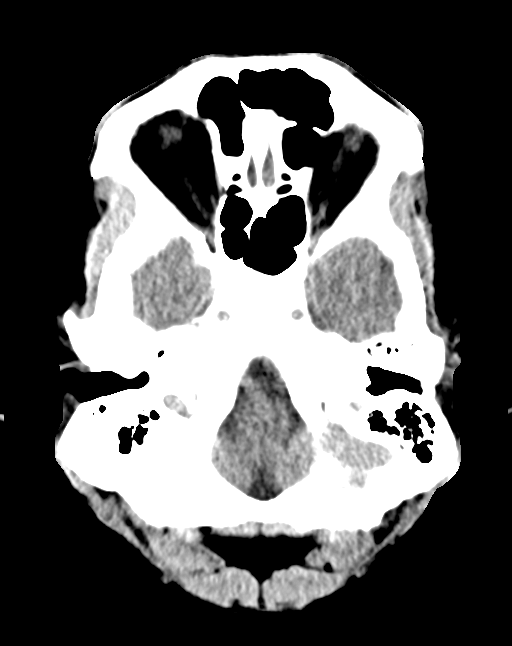
[im 4/30  bone]
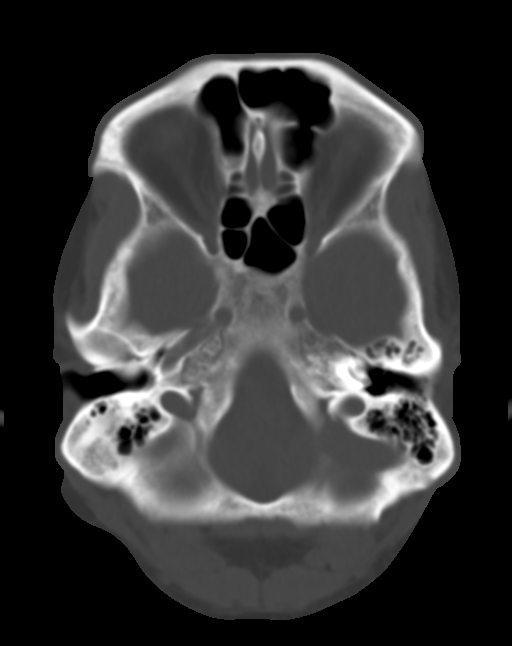
[im 8/30  brain]
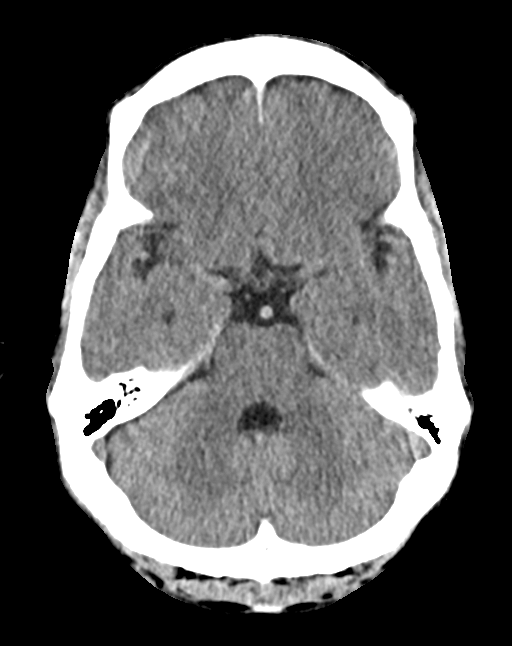
[im 11/30  brain]
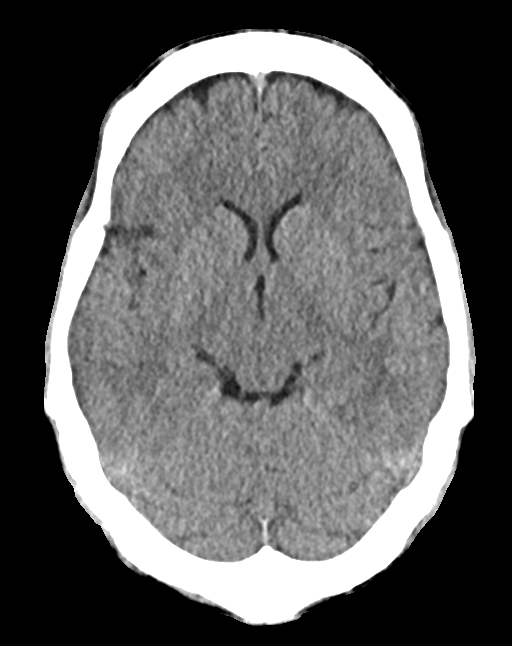
[im 15/30  brain]
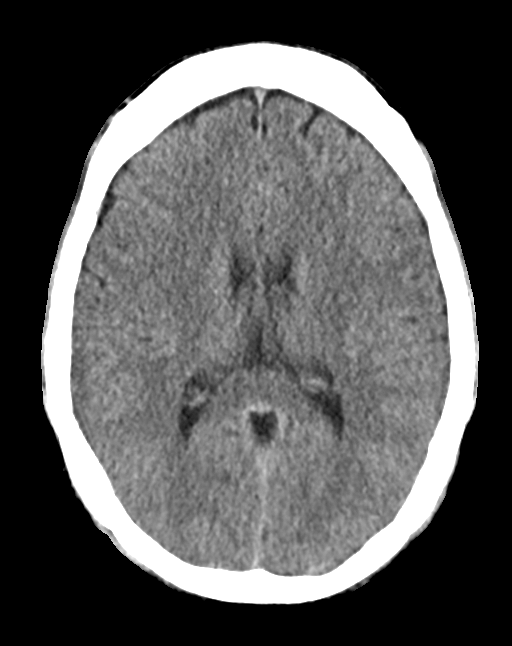
[im 19/30  brain]
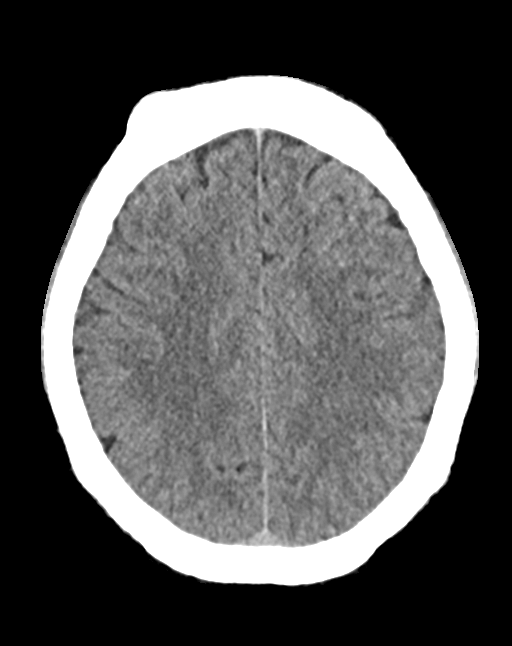
[im 19/30  bone]
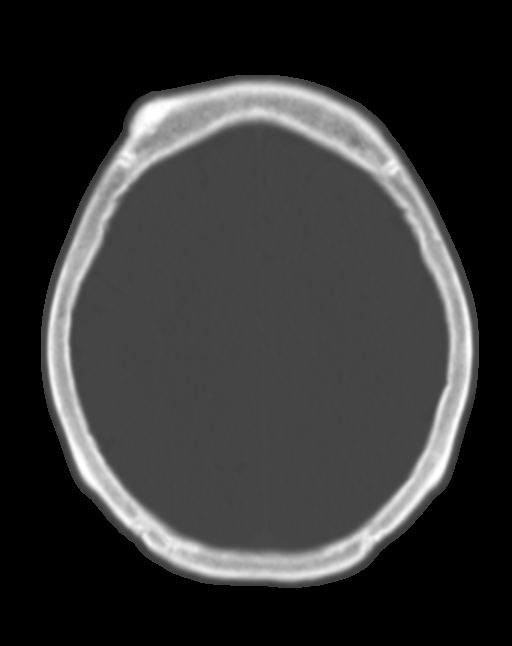
[im 22/30  brain]
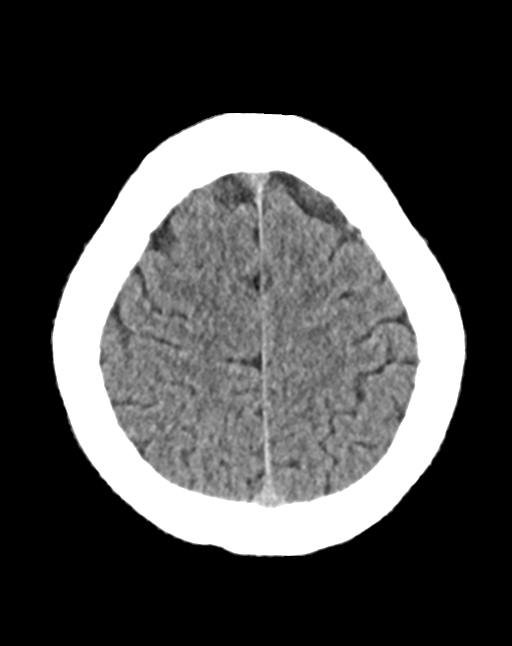
[im 26/30  brain]
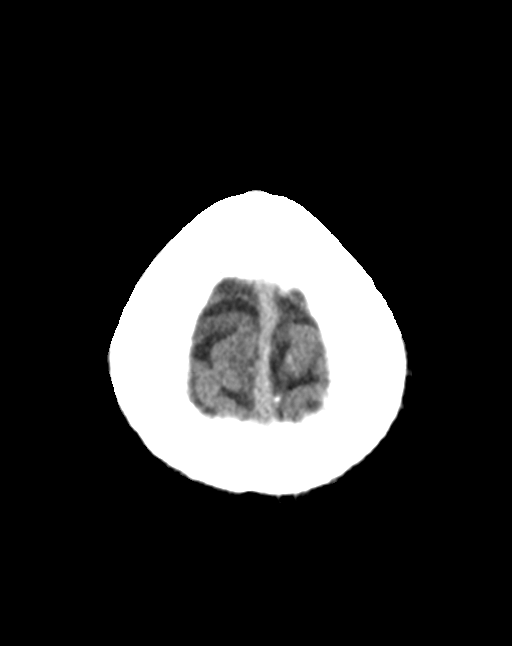

[Series 6: coronals head 3.00 cor · coronal · 0.29mm/px · 3 of 70 slices shown]
[im 24/70  brain]
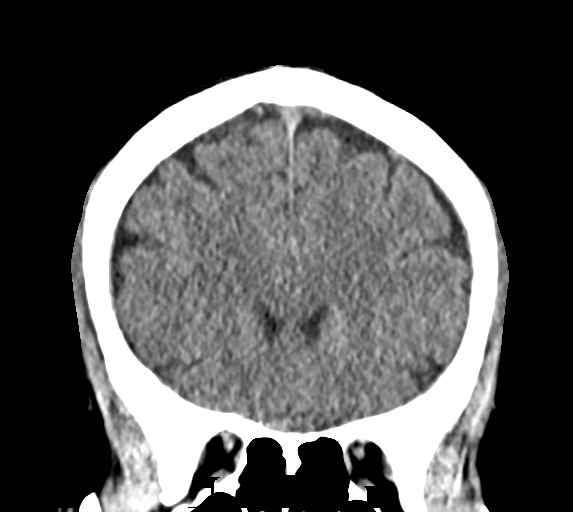
[im 31/70  brain]
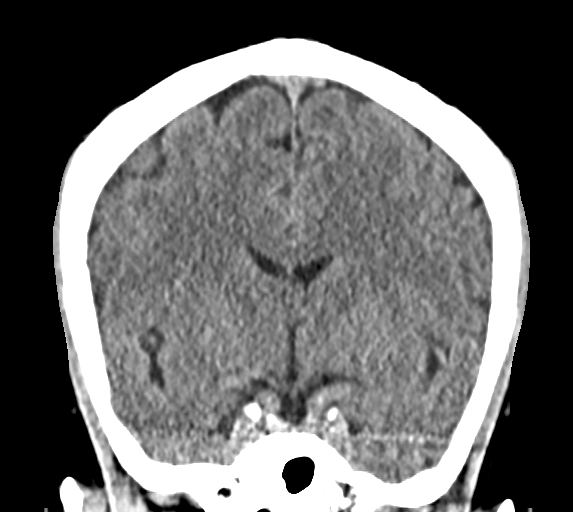
[im 39/70  brain]
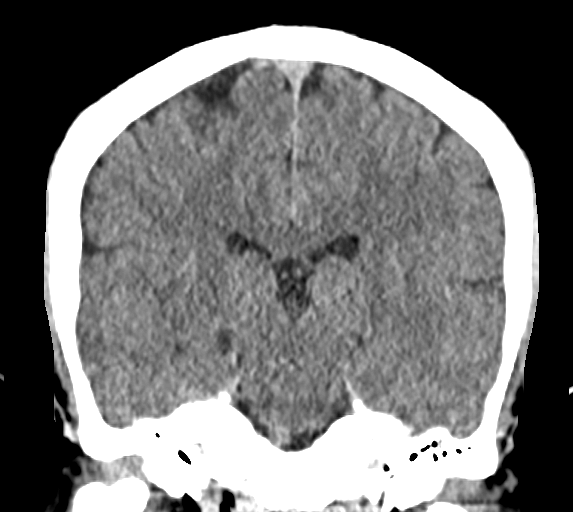

[Series 8: sagittals head 3.00 sag · sagittal · 0.29mm/px · 3 of 56 slices shown]
[im 19/56  brain]
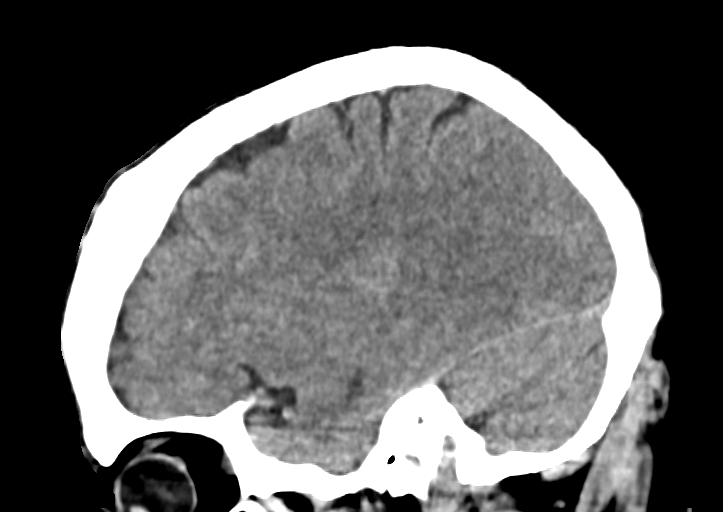
[im 28/56  brain]
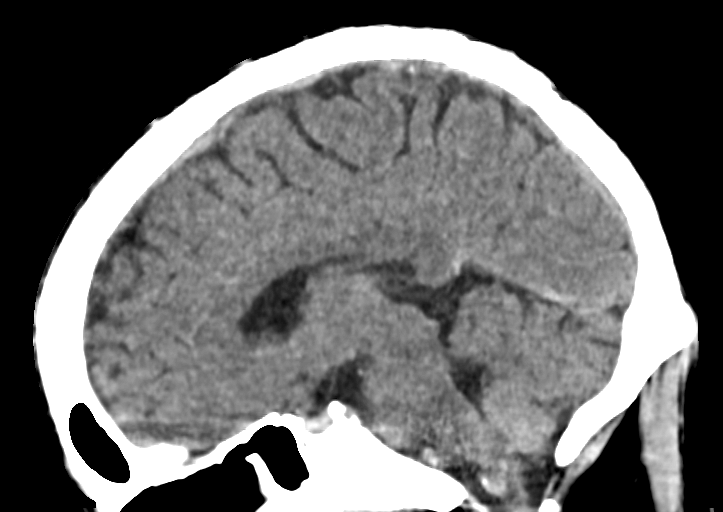
[im 37/56  brain]
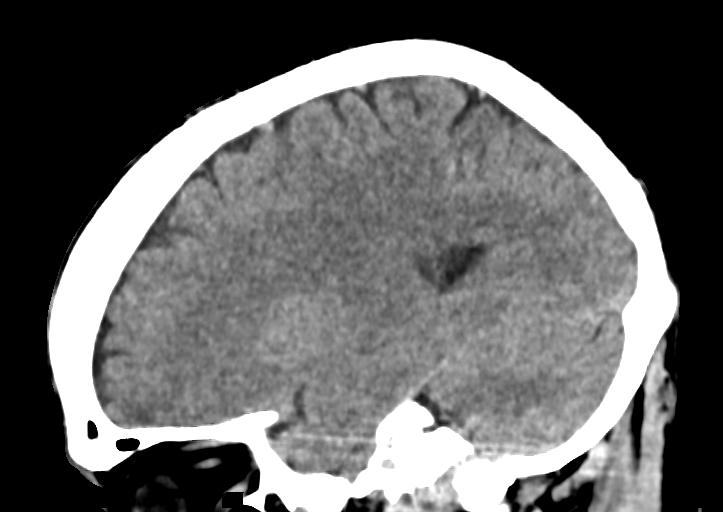

[13 of 47 positions shown; findings below may reference images not displayed]

FINDINGS: Brain: There is no evidence of an acute infarct, intracranial
hemorrhage, mass, midline shift, or extra-axial fluid collection.
The ventricles and sulci are normal.

Vascular: Calcified atherosclerosis at the skull base. No hyperdense
vessel.

Skull: 2.0 x 0.5 cm right frontal skull osteoma corresponding to a
palpable abnormality. No suspicious skull lesion.

Sinuses/Orbits: Sclerosis of the right mastoid tip suggesting
chronic inflammation. Mild right ethmoid air cell mucosal
thickening. Unremarkable included orbits.

Other: 2.3 x 1.3 cm subcutaneous lipoma posterior to the right ear
corresponding to a palpable abnormality.
IMPRESSION: 1. The palpable abnormalities correspond to a right frontal skull
osteoma and right postauricular lipoma.
2. Unremarkable CT appearance of the brain.

## 2021-12-03 ENCOUNTER — Other Ambulatory Visit: Payer: Self-pay

## 2021-12-03 MED ORDER — AMLODIPINE BESYLATE 5 MG PO TABS: 5.0000 mg | ORAL_TABLET | Freq: Every day | ORAL | 0 refills | Status: DC

## 2021-12-25 ENCOUNTER — Other Ambulatory Visit: Payer: Self-pay | Admitting: Family Medicine

## 2022-06-19 ENCOUNTER — Other Ambulatory Visit: Payer: Self-pay | Admitting: Family Medicine

## 2022-07-08 ENCOUNTER — Other Ambulatory Visit: Payer: Self-pay

## 2022-07-08 ENCOUNTER — Ambulatory Visit (HOSPITAL_COMMUNITY)
Admission: EM | Admit: 2022-07-08 | Discharge: 2022-07-08 | Disposition: A | Discharge status: Home / Self Care | Payer: No Typology Code available for payment source | Attending: Internal Medicine | Admitting: Internal Medicine

## 2022-07-08 ENCOUNTER — Encounter (HOSPITAL_COMMUNITY): Payer: Self-pay | Admitting: Emergency Medicine

## 2022-07-08 DIAGNOSIS — Z23 Encounter for immunization: Secondary | ICD-10-CM

## 2022-07-08 DIAGNOSIS — W293XXA Contact with powered garden and outdoor hand tools and machinery, initial encounter: Secondary | ICD-10-CM

## 2022-07-08 DIAGNOSIS — S81012A Laceration without foreign body, left knee, initial encounter: Secondary | ICD-10-CM

## 2022-07-08 MED ORDER — TETANUS-DIPHTH-ACELL PERTUSSIS 5-2.5-18.5 LF-MCG/0.5 IM SUSY
PREFILLED_SYRINGE | INTRAMUSCULAR | Status: AC
  Filled 2022-07-08: qty 0.5

## 2022-07-08 MED ORDER — LIDOCAINE-EPINEPHRINE 1 %-1:100000 IJ SOLN
INTRAMUSCULAR | Status: AC
  Filled 2022-07-08: qty 1

## 2022-07-08 MED ORDER — TETANUS-DIPHTH-ACELL PERTUSSIS 5-2.5-18.5 LF-MCG/0.5 IM SUSY
0.5000 mL | PREFILLED_SYRINGE | Freq: Once | INTRAMUSCULAR | Status: AC
  Administered 2022-07-08: 0.5 mL via INTRAMUSCULAR

## 2022-07-08 NOTE — ED Provider Notes (Signed)
[facility]    CSN: [id number] Arrival date & time: 07/08/22  1808      History   Chief Complaint Chief Complaint  Patient presents with   Knee Pain    HPI Danny Kim is a 52 y.o. male.   Patient presents to urgent care for evaluation of left knee laceration that happened several hours ago while he was using a chainsaw to cut down a small tree in his yard.  Laceration is deep and to the superficial left knee.  Full range of motion of the left knee.  States that the laceration bled significantly after initial injury so he "placed salt in the wound to make the bleeding stop".  Bleeding stopped immediately, however this did cause significant pain.  Pain has improved since applying salt in the wound.  No numbness or tingling distally to injury.  Does not take blood thinners. Denies history of immunosuppression/diabetes.  Unsure of last tetanus injection.      Past Medical History:  Diagnosis Date   DVT (deep venous thrombosis) (HCC)    Generalized headaches    Gout    Hypertension     Patient Active Problem List   Diagnosis Date Noted   Primary hypertension 04/29/2021   Mixed hyperlipidemia 04/29/2021   Mass of head 01/21/2020   Gout 01/21/2020    History reviewed. No pertinent surgical history.     Home Medications    Prior to Admission medications   Medication Sig Start Date End Date Taking? Authorizing Provider  acetaminophen (TYLENOL) 500 MG tablet Take 500-1,000 mg by mouth every 6 (six) hours as needed (for pain or headaches).    [provider]  amLODipine (NORVASC) 5 MG tablet TAKE 1 TABLET (5 MG TOTAL) BY MOUTH DAILY. 06/22/22   [name], MD  colchicine 0.6 MG tablet Take 1 tablet (0.6 mg total) by mouth daily for 7 days. 07/25/21 08/01/21  Rhys Martini, PA-C  ibuprofen (ADVIL,MOTRIN) 200 MG tablet Take 400 mg by mouth daily as needed for mild pain.    [provider]  multivitamin-iron-minerals-folic acid (CENTRUM) chewable  tablet Chew 1 tablet by mouth daily.    [provider]  oxyCODONE-acetaminophen (PERCOCET/ROXICET) 5-325 MG tablet Take 1 tablet by mouth every 6 (six) hours as needed for severe pain. Patient not taking: Reported on 07/08/2022 07/29/21   Horton, Mayer Masker, MD    Family History History reviewed. No pertinent family history.  Social History Social History   Tobacco Use   Smoking status: Never    Passive exposure: Never   Smokeless tobacco: Never  Vaping Use   Vaping Use: Never used  Substance Use Topics   Alcohol use: No   Drug use: No     Allergies   Bee venom   Review of Systems Review of Systems Per HPI  Physical Exam Triage Vital Signs ED Triage Vitals  Enc Vitals Group     BP 07/08/22 1912 129/76     Pulse Rate 07/08/22 1912 81     Resp 07/08/22 1912 20     Temp 07/08/22 1912 98.2 F (36.8 C)     Temp Source 07/08/22 1912 Oral     SpO2 07/08/22 1912 95 %     Weight --      Height --      Head Circumference --      Peak Flow --      Pain Score 07/08/22 1909 8     Pain Loc --  Pain Edu? --      Excl. in GC? --    No data found.  Updated Vital Signs BP 129/76 (BP Location: Right Arm)   Pulse 81   Temp 98.2 F (36.8 C) (Oral)   Resp 20   SpO2 95%   Visual Acuity Right Eye Distance:   Left Eye Distance:   Bilateral Distance:    Right Eye Near:   Left Eye Near:    Bilateral Near:     Physical Exam Vitals and nursing note reviewed.  Constitutional:      Appearance: He is not ill-appearing or toxic-appearing.  HENT:     Head: Normocephalic and atraumatic.     Right Ear: Hearing and external ear normal.     Left Ear: Hearing and external ear normal.     Nose: Nose normal.     Mouth/Throat:     Lips: Pink.  Eyes:     General: Lids are normal. Vision grossly intact. Gaze aligned appropriately.     Extraocular Movements: Extraocular movements intact.     Conjunctiva/sclera: Conjunctivae normal.  Pulmonary:     Effort: Pulmonary  effort is normal.  Musculoskeletal:     Cervical back: Neck supple.     Right knee: Normal.     Left knee: Swelling and laceration present. No deformity, effusion, erythema, ecchymosis, bony tenderness or crepitus. Normal range of motion. Tenderness (laceration) present. Normal alignment, normal meniscus and normal patellar mobility.     Comments: 3cm laceration to the superficial left knee with abrasion laterally. Bleeding controlled currently. Strength and sensation intact distally.  Skin:    General: Skin is warm and dry.     Capillary Refill: Capillary refill takes less than 2 seconds.     Findings: No rash.  Neurological:     General: No focal deficit present.     Mental Status: He is alert and oriented to person, place, and time. Mental status is at baseline.     Cranial Nerves: No dysarthria or facial asymmetry.  Psychiatric:        Mood and Affect: Mood normal.        Speech: Speech normal.        Behavior: Behavior normal.        Thought Content: Thought content normal.        Judgment: Judgment normal.    Left knee after suture repair   Left knee before repair    UC Treatments / Results  Labs (all labs ordered are listed, but only abnormal results are displayed) Labs Reviewed - No data to display  EKG   Radiology No results found.  Procedures Laceration Repair  Date/Time: 07/08/2022 8:25 PM  Performed by: Carlisle Beers, FNP Authorized by: Carlisle Beers, FNP   Consent:    Consent obtained:  Verbal   Consent given by:  Patient   Risks, benefits, and alternatives were discussed: yes     Risks discussed:  Infection, need for additional repair, nerve damage, poor wound healing, poor cosmetic result, pain, retained foreign body, tendon damage and vascular damage   Alternatives discussed:  No treatment Universal protocol:    Patient identity confirmed:  Verbally with patient Anesthesia:    Anesthesia method:  Local infiltration   Local  anesthetic:  Lidocaine 1% WITH epi Laceration details:    Location:  Leg   Leg location:  L knee   Length (cm):  3   Depth (mm):  6 Exploration:    Wound exploration:  wound explored through full range of motion and entire depth of wound visualized   Treatment:    Area cleansed with:  Povidone-iodine and Shur-Clens   Debridement:  None Skin repair:    Repair method:  Sutures   Suture size:  4-0   Suture material:  Prolene (Nylon unavailable)   Suture technique:  Simple interrupted   Number of sutures:  3 Approximation:    Approximation:  Close Repair type:    Repair type:  Simple Post-procedure details:    Dressing:  Non-adherent dressing   Procedure completion:  Tolerated well, no immediate complications  (including critical care time)  Medications Ordered in UC Medications  Tdap (BOOSTRIX) injection 0.5 mL (0.5 mLs Intramuscular Given 07/08/22 1941)    Initial Impression / Assessment and Plan / UC Course  I have reviewed the triage vital signs and the nursing notes.  Pertinent labs & imaging results that were available during my care of the patient were reviewed by me and considered in my medical decision making (see chart for details).   1. Laceration of left knee, contact with chainsaw as cause of accidental injury Laceration to the left knee repaired. See procedure note above for details. Deferred imaging of the left knee based on stable MSK exam findings. Wound cleansed and dressed with nonstick dressing in clinic. Patient instructed to keep wound dry for 24 hours, then they may clean it with antibacterial soap and water gently. Advised not to scrub or rub site to avoid causing the sutures to separate. Dressing changes 2 times daily with nonstick dressing. No ointments, lotions, or powders to the site until site heals. Suture removal in 10 days. Advised to monitor site for signs of infection (redness, swelling, pus, pain) and return to UC sooner than suture removal if  infected. Tylenol may be used every 6 hours as needed for pain once numbing wears off. Advised to rest left leg and avoid exposure to potentially infectious environment. Encouraged to avoid activities that increase tension to the wound/sutures. Tdap updated.   Discussed physical exam and available lab work findings in clinic with patient.  Counseled patient regarding appropriate use of medications and potential side effects for all medications recommended or prescribed today. Discussed red flag signs and symptoms of worsening condition,when to call the PCP office, return to urgent care, and when to seek higher level of care in the emergency department. Patient verbalizes understanding and agreement with plan. All questions answered. Patient discharged in stable condition.    Final Clinical Impressions(s) / UC Diagnoses   Final diagnoses:  Laceration of left knee, initial encounter  Contact with chainsaw as cause of accidental injury     Discharge Instructions      Wound care: Please keep the area surrounding the wound/sutures clean and dry for the next 24 hours. After 24 hours, you may get the wound wet. Gently clean wound with antibacterial soap. Do not scrub wound. Cover the area with a nonstick bandage and change the bandage 2 times a day.   You should have the sutures removed in 10 days by your primary care provider or at urgent care. Return sooner than 10 days if you experience discharge from your laceration, redness around your laceration, warmth around your laceration, or fever.   You may take 600mg  ibuprofen and/or tylenol 1,000mg  every 6 hours as needed for aches/pains to your laceration once the numbing wears off.   Thanks for letting me fix your cut today! Feel better!    ED  Prescriptions   None    PDMP not reviewed this encounter.   Carlisle Beers, [place] 07/08/22 2028

## 2022-07-08 NOTE — ED Triage Notes (Signed)
Laceration to left knee.  Reportedly cut knee with chain saw.  Patient put salt on laceration to stop bleeding. No bleeding currently.  Jagged edges to laceration .  Unknown when last tetanus was recieved

## 2022-07-08 NOTE — Discharge Instructions (Addendum)
Wound care: Please keep the area surrounding the wound/sutures clean and dry for the next 24 hours. After 24 hours, you may get the wound wet. Gently clean wound with antibacterial soap. Do not scrub wound. Cover the area with a nonstick bandage and change the bandage 2 times a day.   You should have the sutures removed in 10 days by your primary care provider or at urgent care. Return sooner than 10 days if you experience discharge from your laceration, redness around your laceration, warmth around your laceration, or fever.   You may take 600mg ibuprofen and/or tylenol 1,000mg every 6 hours as needed for aches/pains to your laceration once the numbing wears off.   Thanks for letting me fix your cut today! Feel better! 

## 2022-07-15 ENCOUNTER — Other Ambulatory Visit: Payer: Self-pay | Admitting: Family Medicine

## 2022-07-18 ENCOUNTER — Ambulatory Visit (HOSPITAL_COMMUNITY)
Admission: EM | Admit: 2022-07-18 | Discharge: 2022-07-18 | Disposition: A | Discharge status: Home / Self Care | Payer: No Typology Code available for payment source

## 2022-07-18 DIAGNOSIS — Z4802 Encounter for removal of sutures: Secondary | ICD-10-CM

## 2022-07-18 NOTE — ED Triage Notes (Signed)
Pt here for suture removal pt has three sutures in left knee

## 2022-09-20 ENCOUNTER — Other Ambulatory Visit: Payer: Self-pay

## 2022-09-20 ENCOUNTER — Encounter: Payer: Self-pay | Admitting: Student

## 2022-09-20 ENCOUNTER — Ambulatory Visit (INDEPENDENT_AMBULATORY_CARE_PROVIDER_SITE_OTHER): Payer: Self-pay | Admitting: Student

## 2022-09-20 VITALS — BP 132/80 | HR 73 | Ht 62.0 in | Wt 178.6 lb

## 2022-09-20 DIAGNOSIS — R42 Dizziness and giddiness: Secondary | ICD-10-CM | POA: Insufficient documentation

## 2022-09-20 MED ORDER — COLCHICINE 0.6 MG PO TABS: 0.6000 mg | ORAL_TABLET | Freq: Every day | ORAL | 0 refills | Status: DC

## 2022-09-20 MED ORDER — AMLODIPINE BESYLATE 5 MG PO TABS: 5.0000 mg | ORAL_TABLET | Freq: Every day | ORAL | 0 refills | Status: DC

## 2022-09-20 NOTE — Assessment & Plan Note (Addendum)
Patient comes in with intermittent complaint of dizziness, off and on for last 3 months.  Patient reports these episodes happen sporadically, and feels like room spinning.  Patient denies any falls/sensation that he might fall.  Patient also notes turning head does not elicit dizziness.  Patient also noted to stay well-hydrated and drink water throughout the day.  Patient has normal orthostatics, and normal neuroexam.  Patient may be suffering from stress, causing the symptoms, as he has no organic cause of his dizziness.  Will recommend patient continue to monitor and keep diary. Will check CBC and BMP for electrolyte issue/anemia. - Keep diary of events - Remain hydrated

## 2022-09-20 NOTE — Patient Instructions (Addendum)
It was great to see you! Thank you for allowing me to participate in your care!  I recommend that you always bring your medications to each appointment as this makes it easy to ensure we are on the correct medications and helps [place] not miss when refills are needed.  Our plans for today:  - Dizziness I'm not sure what is causing your dizziness, so we are going to check some labs to see if there is anything going on.  This may be related to stress.  Keep a diary of how often this happens, and how long events last for. Also report any other symptoms  Seek Emergency Medical Care if:  Symptoms worse  You begin falling  You have dizziness that won't stop  You have dizziness and fevers (100.4 or higher)  You develop weakness or loss of sensation in body   We are checking some labs today, I will call you if they are abnormal will send you a MyChart message or a letter if they are normal.  If you do not hear about your labs in the next 2 weeks please let us know.  Take care and seek immediate care sooner if you develop any concerns.   Dr. Bess Kinds, MD [facility]

## 2022-09-20 NOTE — Progress Notes (Signed)
SUBJECTIVE:   CHIEF COMPLAINT / HPI:   Refill BP & Gout meds -Refill colchicine and amlodipine  Dizziness: Sometimes get's dizzy and some neck pain. Episodes last for a couple seconds and cause him to see spots. They happen sporadically. Notices this happens, sometimes when he reaches down to get something. Has not passed out, and never feels like he might. Also appreciates more headaches, this has been going on for 3 months. Patient also noted to drink a lot of water/stay hydrated during the day. Neck pain is aching in nature and rated 4/10. Also appreciates some numbness in left arm that comes and goes. No etoh, or recreational drugs. Dizziness feels like the room is spinning. Is also having cramping in legs that wake him from sleep 1-2 x a week. Dizziness not brought on by head turning.      PERTINENT  PMH / PSH:   Past Medical History:  Diagnosis Date   DVT (deep venous thrombosis) (HCC)    Generalized headaches    Gout    Hypertension     Patient Care Team: Lorayne Bender, MD as PCP - General OBJECTIVE:  BP 132/80   Pulse 73   Ht 5\' 2"  (1.575 m)   Wt 178 lb 9.6 oz (81 kg)   SpO2 98%   BMI 32.67 kg/m  Physical Exam Constitutional:      General: He is not in acute distress.    Appearance: Normal appearance. He is not ill-appearing.  Cardiovascular:     Rate and Rhythm: Normal rate and regular rhythm.     Pulses: Normal pulses.     Heart sounds: Normal heart sounds. No murmur heard.    No friction rub. No gallop.  Pulmonary:     Effort: Pulmonary effort is normal. No respiratory distress.     Breath sounds: Normal breath sounds. No stridor. No wheezing, rhonchi or rales.  Chest:     Chest wall: No tenderness.  Neurological:     General: No focal deficit present.     Mental Status: He is alert. Mental status is at baseline.     Cranial Nerves: No cranial nerve deficit.     Sensory: No sensory deficit.     Motor: No weakness.  Psychiatric:        Mood and Affect:  Mood normal.      ASSESSMENT/PLAN:  Dizziness Assessment & Plan: Patient comes in with intermittent complaint of dizziness, off and on for last 3 months.  Patient reports these episodes happen sporadically, and feels like room spinning.  Patient denies any falls/sensation that he might fall.  Patient also notes turning head does not elicit dizziness.  Patient also noted to stay well-hydrated and drink water throughout the day.  Patient has normal orthostatics, and normal neuroexam.  Patient may be suffering from stress, causing the symptoms, as he has no organic cause of his dizziness.  Will recommend patient continue to monitor and keep diary. Will check CBC and BMP for electrolyte issue/anemia. - Keep diary of events - Remain hydrated  Orders: -     Basic metabolic panel -     CBC  Other orders -     Colchicine; Take 1 tablet (0.6 mg total) by mouth daily for 7 days.  Dispense: 7 tablet; Refill: 0 -     amLODIPine Besylate; Take 1 tablet (5 mg total) by mouth daily.  Dispense: 30 tablet; Refill: 0   No follow-ups on file. Bess Kinds, MD 09/20/2022, 2:13 PM PGY-3,  [facility]

## 2022-09-21 ENCOUNTER — Encounter: Payer: Self-pay | Admitting: Student

## 2022-09-21 LAB — BASIC METABOLIC PANEL
BUN/Creatinine Ratio: 18 (ref 9–20)
BUN: 19 mg/dL (ref 6–24)
CO2: 22 mmol/L (ref 20–29)
Calcium: 9.2 mg/dL (ref 8.7–10.2)
Chloride: 101 mmol/L (ref 96–106)
Creatinine, Ser: 1.04 mg/dL (ref 0.76–1.27)
Glucose: 97 mg/dL (ref 70–99)
Potassium: 3.6 mmol/L (ref 3.5–5.2)
Sodium: 139 mmol/L (ref 134–144)
eGFR: 86 mL/min/{1.73_m2} (ref >59)

## 2022-09-21 LAB — CBC
Hematocrit: 45.7 % (ref 37.5–51.0)
Hemoglobin: 13.3 g/dL (ref 13.0–17.7)
MCH: 21.9 pg — ABNORMAL LOW (ref 26.6–33.0)
MCHC: 29.1 g/dL — ABNORMAL LOW (ref 31.5–35.7)
MCV: 75 fL — ABNORMAL LOW (ref 79–97)
Platelets: 273 10*3/uL (ref 150–450)
RBC: 6.08 x10E6/uL — ABNORMAL HIGH (ref 4.14–5.80)
RDW: 17.1 % — ABNORMAL HIGH (ref 11.6–15.4)
WBC: 6.4 10*3/uL (ref 3.4–10.8)

## 2022-10-13 ENCOUNTER — Other Ambulatory Visit: Payer: Self-pay | Admitting: Student

## 2022-10-13 DIAGNOSIS — I10 Essential (primary) hypertension: Secondary | ICD-10-CM

## 2022-11-28 ENCOUNTER — Emergency Department (HOSPITAL_COMMUNITY)
Admission: EM | Admit: 2022-11-28 | Discharge: 2022-11-28 | Disposition: A | Discharge status: Home / Self Care | Payer: No Typology Code available for payment source | Attending: Emergency Medicine | Admitting: Emergency Medicine

## 2022-11-28 ENCOUNTER — Emergency Department (HOSPITAL_COMMUNITY): Payer: No Typology Code available for payment source

## 2022-11-28 ENCOUNTER — Other Ambulatory Visit: Payer: Self-pay

## 2022-11-28 ENCOUNTER — Emergency Department (HOSPITAL_BASED_OUTPATIENT_CLINIC_OR_DEPARTMENT_OTHER): Payer: No Typology Code available for payment source

## 2022-11-28 ENCOUNTER — Encounter (HOSPITAL_COMMUNITY): Payer: Self-pay

## 2022-11-28 DIAGNOSIS — M25511 Pain in right shoulder: Secondary | ICD-10-CM | POA: Insufficient documentation

## 2022-11-28 DIAGNOSIS — M7989 Other specified soft tissue disorders: Secondary | ICD-10-CM

## 2022-11-28 DIAGNOSIS — Z79899 Other long term (current) drug therapy: Secondary | ICD-10-CM | POA: Insufficient documentation

## 2022-11-28 DIAGNOSIS — M79604 Pain in right leg: Secondary | ICD-10-CM | POA: Insufficient documentation

## 2022-11-28 DIAGNOSIS — I10 Essential (primary) hypertension: Secondary | ICD-10-CM | POA: Insufficient documentation

## 2022-11-28 DIAGNOSIS — M79605 Pain in left leg: Secondary | ICD-10-CM | POA: Insufficient documentation

## 2022-11-28 DIAGNOSIS — M79601 Pain in right arm: Secondary | ICD-10-CM | POA: Insufficient documentation

## 2022-11-28 LAB — COMPREHENSIVE METABOLIC PANEL
ALT: 36 U/L (ref 0–44)
AST: 27 U/L (ref 15–41)
Albumin: 3.9 g/dL (ref 3.5–5.0)
Alkaline Phosphatase: 84 U/L (ref 38–126)
Anion gap: 10 (ref 5–15)
BUN: 15 mg/dL (ref 6–20)
CO2: 23 mmol/L (ref 22–32)
Calcium: 9.2 mg/dL (ref 8.9–10.3)
Chloride: 105 mmol/L (ref 98–111)
Creatinine, Ser: 1.02 mg/dL (ref 0.61–1.24)
GFR, Estimated: >60 mL/min (ref >60)
Glucose, Bld: 88 mg/dL (ref 70–99)
Potassium: 3.7 mmol/L (ref 3.5–5.1)
Sodium: 138 mmol/L (ref 135–145)
Total Bilirubin: 0.8 mg/dL (ref 0.3–1.2)
Total Protein: 7.4 g/dL (ref 6.5–8.1)

## 2022-11-28 LAB — CBC WITH DIFFERENTIAL/PLATELET
Abs Immature Granulocytes: 0.02 10*3/uL (ref 0.00–0.07)
Basophils Absolute: 0.1 10*3/uL (ref 0.0–0.1)
Basophils Relative: 1 %
Eosinophils Absolute: 0.5 10*3/uL (ref 0.0–0.5)
Eosinophils Relative: 9 %
HCT: 43.2 % (ref 39.0–52.0)
Hemoglobin: 13.1 g/dL (ref 13.0–17.0)
Immature Granulocytes: 0 %
Lymphocytes Relative: 28 %
Lymphs Abs: 1.6 10*3/uL (ref 0.7–4.0)
MCH: 21.3 pg — ABNORMAL LOW (ref 26.0–34.0)
MCHC: 30.3 g/dL (ref 30.0–36.0)
MCV: 70.2 fL — ABNORMAL LOW (ref 80.0–100.0)
Monocytes Absolute: 0.8 10*3/uL (ref 0.1–1.0)
Monocytes Relative: 13 %
Neutro Abs: 2.7 10*3/uL (ref 1.7–7.7)
Neutrophils Relative %: 49 %
Platelets: 278 10*3/uL (ref 150–400)
RBC: 6.15 MIL/uL — ABNORMAL HIGH (ref 4.22–5.81)
RDW: 14.5 % (ref 11.5–15.5)
WBC: 5.7 10*3/uL (ref 4.0–10.5)
nRBC: 0 % (ref 0.0–0.2)

## 2022-11-28 LAB — TROPONIN I (HIGH SENSITIVITY): Troponin I (High Sensitivity): 5 ng/L (ref <18)

## 2022-11-28 LAB — CK: Total CK: 231 U/L (ref 49–397)

## 2022-11-28 MED ORDER — CYCLOBENZAPRINE HCL 10 MG PO TABS: 10.0000 mg | ORAL_TABLET | Freq: Two times a day (BID) | ORAL | 0 refills | Status: DC | PRN

## 2022-11-28 MED ORDER — IBUPROFEN 400 MG PO TABS
400.0000 mg | ORAL_TABLET | Freq: Once | ORAL | Status: AC
  Administered 2022-11-28: 400 mg via ORAL
  Filled 2022-11-28: qty 1

## 2022-11-28 NOTE — Discharge Instructions (Signed)
You were seen today for arm and leg pain. We checked labs, a shoulder X ray, and an ultrasound of your legs to look for blood clots. This did not show any emergent pathology, blood clots, or bone problems. It is likely that your symptoms are due to muscle spasms. I am sending you with a muscle relaxant to help with these symptoms. Please take up to twice a day for your symptoms. Do not drive or go to work while using this medication as it will make you sleepy.

## 2022-11-28 NOTE — Progress Notes (Addendum)
VASCULAR LAB    Bilateral lower extremity venous duplex has been performed.  See CV proc for preliminary results.  Gave verbal report to Dr. Lacie Draft, [facility], RVT 11/28/2022, 7:21 PM

## 2022-11-28 NOTE — ED Triage Notes (Signed)
Pt arrived POV from home c/o right arm pain that radiates up through his shoulder to his back and then bilateral leg pain. Pt has a hx of blood clots.

## 2022-11-28 NOTE — ED Provider Notes (Signed)
[facility] Provider Note   CSN: [id number] Arrival date & time: 11/28/22  1808     History  Chief Complaint  Patient presents with   Arm Pain   Leg Pain   Back Pain   Chest Pain    Danny Kim is a 52 y.o. male with PMH DVT, HTN, gout who presents for 1 month of shoulder pain radiating into the chest and neck and bilateral thigh pain. Patient states symptoms wax and wane in severity. Denies cardiac history, SOB, cough, fevers, congestion. Denies injury to arms or legs before symptom onset. No long travel recently.  The history is provided by the patient and medical records.       Home Medications Prior to Admission medications   Medication Sig Start Date End Date Taking? Authorizing Provider  cyclobenzaprine (FLEXERIL) 10 MG tablet Take 1 tablet (10 mg total) by mouth 2 (two) times daily as needed for muscle spasms. 11/28/22  Yes Karmen Stabs, MD  acetaminophen (TYLENOL) 500 MG tablet Take 500-1,000 mg by mouth every 6 (six) hours as needed (for pain or headaches).    [provider]  amLODipine (NORVASC) 5 MG tablet TAKE 1 TABLET (5 MG TOTAL) BY MOUTH DAILY. 10/14/22   Lorayne Bender, MD  colchicine 0.6 MG tablet Take 1 tablet (0.6 mg total) by mouth daily for 7 days. 09/20/22 09/27/22  Bess Kinds, MD  ibuprofen (ADVIL,MOTRIN) 200 MG tablet Take 400 mg by mouth daily as needed for mild pain.    [provider]  multivitamin-iron-minerals-folic acid (CENTRUM) chewable tablet Chew 1 tablet by mouth daily.    [provider]  oxyCODONE-acetaminophen (PERCOCET/ROXICET) 5-325 MG tablet Take 1 tablet by mouth every 6 (six) hours as needed for severe pain. Patient not taking: Reported on 07/08/2022 07/29/21   Horton, Mayer Masker, MD      Allergies    Bee venom    Review of Systems   Review of Systems per HPI above  Physical Exam Updated Vital Signs BP (!) 144/98   Pulse 63   Temp 97.7 F (36.5 C) (Oral)   Resp  16   Ht 5\' 5"  (1.651 m)   Wt 74.8 kg   SpO2 97%   BMI 27.46 kg/m  Physical Exam Constitutional:      Appearance: He is well-developed.  HENT:     Head: Normocephalic and atraumatic.  Cardiovascular:     Rate and Rhythm: Regular rhythm.     Pulses:          Radial pulses are 2+ on the right side and 2+ on the left side.     Heart sounds: Normal heart sounds.  Pulmonary:     Effort: Pulmonary effort is normal.     Breath sounds: Normal breath sounds.  Chest:     Chest wall: No mass or tenderness.  Abdominal:     Palpations: Abdomen is soft.     Tenderness: There is no abdominal tenderness. There is no guarding or rebound.  Musculoskeletal:     Cervical back: Normal range of motion and neck supple.     Right lower leg: No tenderness. No edema.     Left lower leg: No tenderness. No edema.     Comments: RUE is atraumatic with normal neurovascular exam. Bicep tendon is tender to palpation and anterior shoulder is tender to palpation. Able to range shoulder and elbow without deficit. Pain on bicep flexion against resistance and shoulder abduction and empty  can test. Negative spurling test. No midline C spine tenderness. No shoulder atrophy. Bilateral legs are atraumatic without edema or tenderness. Intact DP pulses. No visible swelling. No signs of compartment syndrome  Skin:    General: Skin is warm and dry.     Capillary Refill: Capillary refill takes less than 2 seconds.  Neurological:     General: No focal deficit present.     ED Results / Procedures / Treatments   Labs (all labs ordered are listed, but only abnormal results are displayed) Labs Reviewed  CBC WITH DIFFERENTIAL/PLATELET - Abnormal; Notable for the following components:      Result Value   RBC 6.15 (*)    MCV 70.2 (*)    MCH 21.3 (*)    All other components within normal limits  COMPREHENSIVE METABOLIC PANEL  CK  TROPONIN I (HIGH SENSITIVITY)    EKG EKG Interpretation Date/Time:  Sunday November 28 2022 18:29:09 EST Ventricular Rate:  65 PR Interval:  173 QRS Duration:  93 QT Interval:  406 QTC Calculation: 423 R Axis:   43  Text Interpretation: Sinus rhythm Borderline repolarization abnormality No significant change since last tracing Confirmed by Richardean Canal [id number]) on 11/28/2022 7:49:57 PM  Radiology DG Chest Portable 1 View  Result Date: 11/28/2022 CLINICAL DATA:  Shoulder pain EXAM: PORTABLE CHEST 1 VIEW COMPARISON:  06/16/2020 FINDINGS: Lungs are clear.  No pleural effusion or pneumothorax. The heart is normal in size. IMPRESSION: No acute cardiopulmonary disease. Electronically Signed   By: Charline Bills M.D.   On: 11/28/2022 19:02   DG Shoulder Right  Result Date: 11/28/2022 CLINICAL DATA:  Right shoulder pain EXAM: RIGHT SHOULDER - 2+ VIEW COMPARISON:  None Available. FINDINGS: No fracture or dislocation is seen. The joint spaces are preserved. Visualized soft tissues are within normal limits. Visualized right lung is clear. IMPRESSION: Negative. Electronically Signed   By: Charline Bills M.D.   On: 11/28/2022 19:01    Procedures Procedures    Medications Ordered in ED Medications  ibuprofen (ADVIL) tablet 400 mg (400 mg Oral Given 11/28/22 1922)    ED Course/ Medical Decision Making/ A&P                                 Medical Decision Making Amount and/or Complexity of Data Reviewed Labs: ordered. Radiology: ordered.  Risk Prescription drug management.   52 year old male presents with 1 month of arm/shoulder pain radiating into the chest and 1 month of bilateral thigh pain.    Differential for his arm and shoulder pain includes fracture, dislocation, tendinitis, rotator cuff pathology, myositis.  Lower concern for effusion or septic joint as he is able to range the shoulder without deficit.  Lower concern for cervical radiculopathy as Spurling test is negative.  Lower concern for upper extremity DVT as there is no appreciable edema. X-ray of the  shoulder was obtained to evaluate for fracture or dislocation and shows no acute pathology.   Differential for his bilateral thigh pain includes myositis, bursitis, myalgias, rhabdo, DVT, electrolyte derangement. Basic labs including electrolytes, CK obtained and notable for normal CK, normal troponin, normal electrolytes and renal function, normal hemoglobin and no leukocytosis. Bilateral DVT US obtained and shows no evidence of DVT.  Chest x-ray was obtained and shows no acute cardiopulmonary abnormality.   Based on above workup acute life-threatening pathology for arm, shoulder, or bilateral leg pain is exceedingly unlikely.  Suspect  symptoms are due to muscle spasm or muscle strain.  Discussed this diagnosis as well as above workup with patient.  Will send Flexeril to preferred pharmacy.  Given ibuprofen in ED. Strict precautions discussed.  PCP follow-up as needed.  Work note provided.  Discharged from ED in stable condition.          Final Clinical Impression(s) / ED Diagnoses Final diagnoses:  Right arm pain  Bilateral leg pain    Rx / DC Orders ED Discharge Orders          Ordered    cyclobenzaprine (FLEXERIL) 10 MG tablet  2 times daily PRN        11/28/22 1955              Karmen Stabs, MD 11/28/22 1958    Charlynne Pander, MD 12/01/22 [phone]

## 2022-11-28 NOTE — ED Notes (Signed)
Upon assessing patient, he advised he had central chest pain, back pain and pain radiating down neck and arm. EKG performed, providers made aware.

## 2022-12-22 ENCOUNTER — Encounter (HOSPITAL_COMMUNITY): Payer: Self-pay

## 2022-12-22 ENCOUNTER — Ambulatory Visit (HOSPITAL_COMMUNITY)
Admission: EM | Admit: 2022-12-22 | Discharge: 2022-12-22 | Disposition: A | Discharge status: Home / Self Care | Payer: No Typology Code available for payment source | Attending: Emergency Medicine | Admitting: Emergency Medicine

## 2022-12-22 DIAGNOSIS — L509 Urticaria, unspecified: Secondary | ICD-10-CM

## 2022-12-22 DIAGNOSIS — I10 Essential (primary) hypertension: Secondary | ICD-10-CM

## 2022-12-22 DIAGNOSIS — T7840XA Allergy, unspecified, initial encounter: Secondary | ICD-10-CM

## 2022-12-22 MED ORDER — DIPHENHYDRAMINE HCL 25 MG PO CAPS
25.0000 mg | ORAL_CAPSULE | Freq: Once | ORAL | Status: AC
  Administered 2022-12-22: 25 mg via ORAL

## 2022-12-22 MED ORDER — DIPHENHYDRAMINE HCL 25 MG PO CAPS
ORAL_CAPSULE | ORAL | Status: AC
  Filled 2022-12-22: qty 1

## 2022-12-22 MED ORDER — AMLODIPINE BESYLATE 5 MG PO TABS: 5.0000 mg | ORAL_TABLET | Freq: Every day | ORAL | 0 refills | Status: DC

## 2022-12-22 MED ORDER — METHYLPREDNISOLONE SODIUM SUCC 125 MG IJ SOLR
INTRAMUSCULAR | Status: AC
  Filled 2022-12-22: qty 2

## 2022-12-22 MED ORDER — DIPHENHYDRAMINE HCL 25 MG PO TABS: 25.0000 mg | ORAL_TABLET | Freq: Four times a day (QID) | ORAL | 0 refills | Status: DC | PRN

## 2022-12-22 MED ORDER — METHYLPREDNISOLONE SODIUM SUCC 125 MG IJ SOLR
60.0000 mg | Freq: Once | INTRAMUSCULAR | Status: AC
  Administered 2022-12-22: 60 mg via INTRAMUSCULAR

## 2022-12-22 NOTE — Discharge Instructions (Addendum)
Go home and shower in warm water with a hypoallergenic soap like Dove sensitive skin.  I suggest washing all your close, sheets and towels in hot water with an hypoallergenic solution like Tide Free and clear.  You can take the Benadryl every 6 hours as needed for any itching.  It is unclear what caused your reaction, so be cautious at home.  Do not hesitate to seek immediate care if you develop any oral swelling, wheezing or shortness of breath.  I have sent in a 30-day prescription of your amlodipine.  It appears you have 1 refill in the pharmacy for 90-day supply.  Please follow-up with your primary care provider regarding ongoing control of your chronic hypertension.  Return to clinic for any new or urgent symptoms.

## 2022-12-22 NOTE — ED Provider Notes (Signed)
[facility]    CSN: [id number] Arrival date & time: 12/22/22  0865      History   Chief Complaint Chief Complaint  Patient presents with   Urticaria    HPI Danny Kim is a 52 y.o. male.   Patient presents to clinic with his son.  Patient woke up around 6 AM this morning to go to work and he had hives all over his body.  He has not tried any interventions for this.   He is allergic to bee venom, denies any exposure to bee stings recently.  Denies any changes to soaps or detergents.  He has not tried any new foods or changed his diet recently.  He has never needed an EpiPen or had anaphylaxis.  Reports his throat feels scratchy.  Denies any shortness of breath or oral swelling.  He is scratching at the hives in clinic.  He does have a history of hypertension, he has been out of his amlodipine for about 2 months.  No vision changes or headache.   The history is provided by the patient, medical records and a relative.  Urticaria    Past Medical History:  Diagnosis Date   DVT (deep venous thrombosis) (HCC)    Generalized headaches    Gout    Hypertension     Patient Active Problem List   Diagnosis Date Noted   Dizziness 09/20/2022   Primary hypertension 04/29/2021   Mixed hyperlipidemia 04/29/2021   Mass of head 01/21/2020   Gout 01/21/2020    History reviewed. No pertinent surgical history.     Home Medications    Prior to Admission medications   Medication Sig Start Date End Date Taking? Authorizing Provider  amLODipine (NORVASC) 5 MG tablet Take 1 tablet (5 mg total) by mouth daily. 12/22/22  Yes Rinaldo Ratel, [place] N, FNP  diphenhydrAMINE (BENADRYL) 25 MG tablet Take 1 tablet (25 mg total) by mouth every 6 (six) hours as needed. 12/22/22  Yes Rinaldo Ratel, [place] N, FNP  amLODipine (NORVASC) 5 MG tablet TAKE 1 TABLET (5 MG TOTAL) BY MOUTH DAILY. 10/14/22   Lorayne Bender, MD  colchicine 0.6 MG tablet Take 1 tablet (0.6 mg total) by mouth daily for 7  days. 09/20/22 09/27/22  Bess Kinds, MD  cyclobenzaprine (FLEXERIL) 10 MG tablet Take 1 tablet (10 mg total) by mouth 2 (two) times daily as needed for muscle spasms. 11/28/22   Karmen Stabs, MD  ibuprofen (ADVIL,MOTRIN) 200 MG tablet Take 400 mg by mouth daily as needed for mild pain.    [provider]  multivitamin-iron-minerals-folic acid (CENTRUM) chewable tablet Chew 1 tablet by mouth daily.    [provider]  oxyCODONE-acetaminophen (PERCOCET/ROXICET) 5-325 MG tablet Take 1 tablet by mouth every 6 (six) hours as needed for severe pain. Patient not taking: Reported on 07/08/2022 07/29/21   Horton, Mayer Masker, MD    Family History History reviewed. No pertinent family history.  Social History Social History   Tobacco Use   Smoking status: Never    Passive exposure: Never   Smokeless tobacco: Never  Vaping Use   Vaping status: Never Used  Substance Use Topics   Alcohol use: No   Drug use: No     Allergies   Bee venom   Review of Systems Review of Systems  Per HPI   Physical Exam Triage Vital Signs ED Triage Vitals  Encounter Vitals Group     BP 12/22/22 0851 (!) 146/119     Systolic BP Percentile --  Diastolic BP Percentile --      Pulse Rate 12/22/22 0851 69     Resp 12/22/22 0851 18     Temp 12/22/22 0851 (!) 94.5 F (34.7 C)     Temp Source 12/22/22 0851 Oral     SpO2 12/22/22 0851 100 %     Weight 12/22/22 0858 160 lb (72.6 kg)     Height --      Head Circumference --      Peak Flow --      Pain Score 12/22/22 0857 6     Pain Loc --      Pain Education --      Exclude from Growth Chart --    No data found.  Updated Vital Signs BP (!) 146/119 (BP Location: Left Arm)   Pulse 69   Temp (!) 94.5 F (34.7 C) (Oral) Comment: unable to obtain accurate temperature.  Resp 18   Wt 160 lb (72.6 kg)   SpO2 100%   BMI 26.63 kg/m   Visual Acuity Right Eye Distance:   Left Eye Distance:   Bilateral Distance:    Right Eye Near:    Left Eye Near:    Bilateral Near:     Physical Exam Vitals and nursing note reviewed.  Constitutional:      Appearance: Normal appearance.  HENT:     Head: Normocephalic and atraumatic.     Right Ear: External ear normal.     Left Ear: External ear normal.     Nose: Nose normal.     Mouth/Throat:     Mouth: Mucous membranes are moist.  Eyes:     Conjunctiva/sclera: Conjunctivae normal.  Cardiovascular:     Rate and Rhythm: Normal rate.  Pulmonary:     Effort: Pulmonary effort is normal. No respiratory distress.  Musculoskeletal:        General: Normal range of motion.  Skin:    General: Skin is warm and dry.     Findings: Rash present. Rash is urticarial.     Comments: Hives present across entire body.  Urticarial erythematous areas are worse on hands and forearms.  Neurological:     General: No focal deficit present.     Mental Status: He is alert and oriented to person, place, and time.  Psychiatric:        Mood and Affect: Mood normal.        Behavior: Behavior normal. Behavior is cooperative.      UC Treatments / Results  Labs (all labs ordered are listed, but only abnormal results are displayed) Labs Reviewed - No data to display  EKG   Radiology No results found.  Procedures Procedures (including critical care time)  Medications Ordered in UC Medications  diphenhydrAMINE (BENADRYL) capsule 25 mg (25 mg Oral Given 12/22/22 0909)  methylPREDNISolone sodium succinate (SOLU-MEDROL) 125 mg/2 mL injection 60 mg (60 mg Intramuscular Given 12/22/22 0909)  diphenhydrAMINE (BENADRYL) capsule 25 mg (25 mg Oral Given 12/22/22 0959)    Initial Impression / Assessment and Plan / UC Course  I have reviewed the triage vital signs and the nursing notes.  Pertinent labs & imaging results that were available during my care of the patient were reviewed by me and considered in my medical decision making (see chart for details).  Vitals and triage reviewed, patient is  hemodynamically stable.  Hypertensive with history of same.  Oxygenation of 100%, able to speak in full sentences, no drooling and no acute respiratory distress.  No history of anaphylaxis.  Systemic hives on presentation.  Oral Benadryl and IM Solu-Medrol given.   Scratchy throat, no obvious airway swelling on physical exam.  Will monitor for 30 min and reassess to ensure improvement and that patient does not need IM epinephrine or hospital evaluation.  1610 - hives have started to improve slightly.  Scratchy throat remains.  Will give 25 mg more of oral Benadryl and watch for another 30 minutes.  Blood pressure on recheck was 152/101. Oxygen 98% RA, able to speak in full sentences. Tolerating PO intake.   1030-hives have markedly improved.  Reports throat scratchiness is better.  Lungs are vesicular, heart with regular rate and rhythm.  Feel as if patient is stable for discharge as he has been observed in clinic for 2 hours and symptoms have been present for 4-1/2 without any angioedema or anaphylaxis.  Plan of care, follow-up care and strict emergency precautions given, no questions at this time.    Final Clinical Impressions(s) / UC Diagnoses   Final diagnoses:  Allergic reaction, initial encounter  Full body hives  Essential hypertension     Discharge Instructions      Go home and shower in warm water with a hypoallergenic soap like Dove sensitive skin.  I suggest washing all your close, sheets and towels in hot water with an hypoallergenic solution like Tide Free and clear.  You can take the Benadryl every 6 hours as needed for any itching.  It is unclear what caused your reaction, so be cautious at home.  Do not hesitate to seek immediate care if you develop any oral swelling, wheezing or shortness of breath.  I have sent in a 30-day prescription of your amlodipine.  It appears you have 1 refill in the pharmacy for 90-day supply.  Please follow-up with your primary care provider  regarding ongoing control of your chronic hypertension.  Return to clinic for any new or urgent symptoms.      ED Prescriptions     Medication Sig Dispense Auth. Provider   amLODipine (NORVASC) 5 MG tablet Take 1 tablet (5 mg total) by mouth daily. 30 tablet Rinaldo Ratel, [place] N, [place]   diphenhydrAMINE (BENADRYL) 25 MG tablet Take 1 tablet (25 mg total) by mouth every 6 (six) hours as needed. 30 tablet [place] Viereck, [place] N, [place]      PDMP not reviewed this encounter.   Lanier Felty, [place] N, [place] 12/22/22 1034

## 2022-12-22 NOTE — ED Triage Notes (Addendum)
Pt presents with hives & itching all over body since this AM @ 0630. Pt reports taking Zyrtec around 0800 with no improvement. Pt does report chest discomfort, respirations regular/unlabored/even in triage. Pt currently rates pain 6/10. Pt is unsure of what has caused the hives.

## 2022-12-22 NOTE — ED Notes (Addendum)
This RN spoke with [place] NP face to face about pt's current symptoms.

## 2023-01-03 ENCOUNTER — Emergency Department (HOSPITAL_COMMUNITY): Payer: No Typology Code available for payment source

## 2023-01-03 ENCOUNTER — Emergency Department (HOSPITAL_COMMUNITY)
Admission: EM | Admit: 2023-01-03 | Discharge: 2023-01-04 | Discharge status: Against Medical Advice | Payer: No Typology Code available for payment source | Attending: Emergency Medicine | Admitting: Emergency Medicine

## 2023-01-03 ENCOUNTER — Encounter (HOSPITAL_COMMUNITY): Payer: Self-pay

## 2023-01-03 ENCOUNTER — Other Ambulatory Visit: Payer: Self-pay

## 2023-01-03 DIAGNOSIS — M25511 Pain in right shoulder: Secondary | ICD-10-CM | POA: Insufficient documentation

## 2023-01-03 DIAGNOSIS — R0602 Shortness of breath: Secondary | ICD-10-CM | POA: Insufficient documentation

## 2023-01-03 DIAGNOSIS — Z5321 Procedure and treatment not carried out due to patient leaving prior to being seen by health care provider: Secondary | ICD-10-CM | POA: Insufficient documentation

## 2023-01-03 DIAGNOSIS — R079 Chest pain, unspecified: Secondary | ICD-10-CM | POA: Insufficient documentation

## 2023-01-03 LAB — BASIC METABOLIC PANEL
Anion gap: 12 (ref 5–15)
BUN: 16 mg/dL (ref 6–20)
CO2: 22 mmol/L (ref 22–32)
Calcium: 9.3 mg/dL (ref 8.9–10.3)
Chloride: 104 mmol/L (ref 98–111)
Creatinine, Ser: 0.81 mg/dL (ref 0.61–1.24)
GFR, Estimated: >60 mL/min (ref >60)
Glucose, Bld: 75 mg/dL (ref 70–99)
Potassium: 3.6 mmol/L (ref 3.5–5.1)
Sodium: 138 mmol/L (ref 135–145)

## 2023-01-03 LAB — TROPONIN I (HIGH SENSITIVITY)
Troponin I (High Sensitivity): 5 ng/L (ref <18)
Troponin I (High Sensitivity): 5 ng/L (ref <18)

## 2023-01-03 LAB — CBC
HCT: 45.6 % (ref 39.0–52.0)
Hemoglobin: 13.9 g/dL (ref 13.0–17.0)
MCH: 21.8 pg — ABNORMAL LOW (ref 26.0–34.0)
MCHC: 30.5 g/dL (ref 30.0–36.0)
MCV: 71.6 fL — ABNORMAL LOW (ref 80.0–100.0)
Platelets: 278 10*3/uL (ref 150–400)
RBC: 6.37 MIL/uL — ABNORMAL HIGH (ref 4.22–5.81)
RDW: 15 % (ref 11.5–15.5)
WBC: 7.9 10*3/uL (ref 4.0–10.5)
nRBC: 0 % (ref 0.0–0.2)

## 2023-01-03 NOTE — ED Provider Triage Note (Signed)
Emergency Medicine Provider Triage Evaluation Note  Danny Kim , a 52 y.o. male  was evaluated in triage.  Pt complains of pain in right shoulder and chest. Shoulder pain started several months ago. Chest pain is also right sided and started a few days ago. Feels sharp and endorses intermittent shortness of breath.   Review of Systems  Positive: See above Negative: See above  Physical Exam  BP (!) 158/101 (BP Location: Right Arm)   Pulse 71   Temp 97.8 F (36.6 C) (Oral)   Resp 17   Ht 5\' 5"  (1.651 m)   Wt 72.6 kg   SpO2 100%   BMI 26.63 kg/m  Gen:   Awake, no distress   Resp:  Normal effort  MSK:   Moves extremities without difficulty  Other:    Medical Decision Making  Medically screening exam initiated at 6:57 PM.  Appropriate orders placed.  Danny Kim was informed that the remainder of the evaluation will be completed by another provider, this initial triage assessment does not replace that evaluation, and the importance of remaining in the ED until their evaluation is complete.  Work up started   Danny Eagle, PA-C 01/03/23 1858

## 2023-01-03 NOTE — ED Triage Notes (Signed)
Pt states his whole right side to the back to shoulder blade is aching. Pt gout is kicking in. Pt has been treated for this pain in the past but today it came back around 0800. Pt takes Tylenol at home but doesn't work. Pt denies injury. He just woke with an ache.

## 2023-01-04 ENCOUNTER — Ambulatory Visit (HOSPITAL_COMMUNITY)
Admission: EM | Admit: 2023-01-04 | Discharge: 2023-01-04 | Disposition: A | Discharge status: Home / Self Care | Payer: Self-pay | Attending: Family Medicine | Admitting: Family Medicine

## 2023-01-04 ENCOUNTER — Encounter (HOSPITAL_COMMUNITY): Payer: Self-pay

## 2023-01-04 DIAGNOSIS — M79601 Pain in right arm: Secondary | ICD-10-CM

## 2023-01-04 DIAGNOSIS — G8929 Other chronic pain: Secondary | ICD-10-CM

## 2023-01-04 DIAGNOSIS — M1A0720 Idiopathic chronic gout, left ankle and foot, without tophus (tophi): Secondary | ICD-10-CM

## 2023-01-04 DIAGNOSIS — M25511 Pain in right shoulder: Secondary | ICD-10-CM

## 2023-01-04 MED ORDER — KETOROLAC TROMETHAMINE 30 MG/ML IJ SOLN
INTRAMUSCULAR | Status: AC
  Filled 2023-01-04: qty 1

## 2023-01-04 MED ORDER — METHYLPREDNISOLONE 4 MG PO TBPK: ORAL_TABLET | ORAL | 0 refills | Status: DC

## 2023-01-04 MED ORDER — COLCHICINE 0.6 MG PO TABS: 0.6000 mg | ORAL_TABLET | Freq: Every day | ORAL | 0 refills | Status: DC

## 2023-01-04 MED ORDER — KETOROLAC TROMETHAMINE 30 MG/ML IJ SOLN
30.0000 mg | Freq: Once | INTRAMUSCULAR | Status: AC
  Administered 2023-01-04: 30 mg via INTRAMUSCULAR

## 2023-01-04 NOTE — ED Provider Notes (Signed)
[facility]    CSN: [id number] Arrival date & time: 01/04/23  0944      History   Chief Complaint Chief Complaint  Patient presents with   Joint Pain    HPI Danny Kim is a 52 y.o. male.   Patient is here for right shoulder/elbow pain for several months.  The pain has been coming and going, but worsens every time it flares.   No known injury.  He is right handed.  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  Took motrin last night, but did not help.  He states  this feels like gout, but has not had gout in these joints before.  He went to the ER yesterday and left due to wait.  Xray of chest and right shoulder normal.  Lab work normal.   He also started with left heel pain yesterday.  He has h/o gout and this feels the same.   He does have some colchicine at home, used one yesterday, but not today.  Needs a refills.  He would like a shot of toradol.       Past Medical History:  Diagnosis Date   DVT (deep venous thrombosis) (HCC)    Generalized headaches    Gout    Hypertension     Patient Active Problem List   Diagnosis Date Noted   Dizziness 09/20/2022   Primary hypertension 04/29/2021   Mixed hyperlipidemia 04/29/2021   Mass of head 01/21/2020   Gout 01/21/2020    History reviewed. No pertinent surgical history.     Home Medications    Prior to Admission medications   Medication Sig Start Date End Date Taking? Authorizing Provider  amLODipine (NORVASC) 5 MG tablet TAKE 1 TABLET (5 MG TOTAL) BY MOUTH DAILY. 10/14/22   Hindel, Tacey Ruiz, MD  amLODipine (NORVASC) 5 MG tablet Take 1 tablet (5 mg total) by mouth daily. 12/22/22   Garrison, [place] N, FNP  colchicine 0.6 MG tablet Take 1 tablet (0.6 mg total) by mouth daily for 7 days. 09/20/22 09/27/22  Bess Kinds, MD  cyclobenzaprine (FLEXERIL) 10 MG tablet Take 1 tablet (10 mg total) by mouth 2 (two) times daily as needed for muscle spasms. 11/28/22   Karmen Stabs, MD  diphenhydrAMINE (BENADRYL) 25  MG tablet Take 1 tablet (25 mg total) by mouth every 6 (six) hours as needed. 12/22/22   Garrison, [place] N, FNP  ibuprofen (ADVIL,MOTRIN) 200 MG tablet Take 400 mg by mouth daily as needed for mild pain.    [provider]  multivitamin-iron-minerals-folic acid (CENTRUM) chewable tablet Chew 1 tablet by mouth daily.    [provider]  oxyCODONE-acetaminophen (PERCOCET/ROXICET) 5-325 MG tablet Take 1 tablet by mouth every 6 (six) hours as needed for severe pain. Patient not taking: Reported on 07/08/2022 07/29/21   Horton, Mayer Masker, MD    Family History History reviewed. No pertinent family history.  Social History Social History   Tobacco Use   Smoking status: Never    Passive exposure: Never   Smokeless tobacco: Never  Vaping Use   Vaping status: Never Used  Substance Use Topics   Alcohol use: No   Drug use: No     Allergies   Bee venom   Review of Systems Review of Systems  Constitutional: Negative.   HENT: Negative.    Respiratory: Negative.    Cardiovascular: Negative.   Gastrointestinal: Negative.   Musculoskeletal:  Positive for arthralgias and joint swelling.  Physical Exam Triage Vital Signs ED Triage Vitals [01/04/23 1129]  Encounter Vitals Group     BP (!) 147/98     Systolic BP Percentile      Diastolic BP Percentile      Pulse Rate 73     Resp 18     Temp 98 F (36.7 C)     Temp Source Oral     SpO2 98 %     Weight      Height      Head Circumference      Peak Flow      Pain Score 8     Pain Loc      Pain Education      Exclude from Growth Chart    No data found.  Updated Vital Signs BP (!) 147/98 (BP Location: Right Arm)   Pulse 73   Temp 98 F (36.7 C) (Oral)   Resp 18   SpO2 98%   Visual Acuity Right Eye Distance:   Left Eye Distance:   Bilateral Distance:    Right Eye Near:   Left Eye Near:    Bilateral Near:     Physical Exam Constitutional:      Appearance: Normal appearance.  Cardiovascular:      Rate and Rhythm: Normal rate and regular rhythm.  Pulmonary:     Effort: Pulmonary effort is normal.     Breath sounds: Normal breath sounds.  Musculoskeletal:     Comments: No spinous tenderness;  TTP to the right upper back/trapezius;  TTP to the anterior shoulder, and upper arm;  TTP down to the elbow;  TTP to the lateral and medial elbow;   Decreased rom to the right shoulder due to pain with active/passive motion;   Pain to the upper arm with resisted flexion and extension of the wrist  There is swelling to the left lateral ankle, and just below this;  the area is tender; decreased rom due to pain  Neurological:     General: No focal deficit present.     Mental Status: He is alert.  Psychiatric:        Mood and Affect: Mood normal.      UC Treatments / Results  Labs (all labs ordered are listed, but only abnormal results are displayed) Labs Reviewed - No data to display  EKG   Radiology DG Shoulder Right  Result Date: 01/03/2023 CLINICAL DATA:  Right shoulder pain EXAM: RIGHT SHOULDER - 2+ VIEW COMPARISON:  11/28/2022 FINDINGS: There is no evidence of fracture or dislocation. There is no evidence of arthropathy or other focal bone abnormality. Soft tissues are unremarkable. IMPRESSION: No acute abnormality noted. Electronically Signed   By: Alcide Clever M.D.   On: 01/03/2023 20:02   DG Chest 1 View  Result Date: 01/03/2023 CLINICAL DATA:  Chest pain EXAM: PORTABLE CHEST 1 VIEW COMPARISON:  11/28/2022 FINDINGS: The heart size and mediastinal contours are within normal limits. Both lungs are clear. The visualized skeletal structures are unremarkable. IMPRESSION: No active disease. Electronically Signed   By: Alcide Clever M.D.   On: 01/03/2023 20:01    Procedures Procedures (including critical care time)  Medications Ordered in UC Medications - No data to display  Initial Impression / Assessment and Plan / UC Course  I have reviewed the triage vital signs and the  nursing notes.  Pertinent labs & imaging results that were available during my care of the patient were reviewed by me and considered in my  medical decision making (see chart for details).   Final Clinical Impressions(s) / UC Diagnoses   Final diagnoses:  Chronic right shoulder pain  Right arm pain  Chronic gout of left foot, unspecified cause     Discharge Instructions      You were seen today for chronic shoulder/arm pain and gout of the foot.  Your xray yesterday was normal.   This is likely tendon/ligament/rotator cuff in etiology and you need to follow up with your primary care provider for further work up.   I have given you a shot of toradol today for both shoulder pain and gout.  I have sent out a steroid pack, and a refill of colchicine as well.     ED Prescriptions     Medication Sig Dispense Auth. Provider   methylPREDNISolone (MEDROL DOSEPAK) 4 MG TBPK tablet Take as directed 1 each Inanna Telford, MD   colchicine 0.6 MG tablet Take 1 tablet (0.6 mg total) by mouth daily for 7 days. 7 tablet Jannifer Franklin, MD      PDMP not reviewed this encounter.   Jannifer Franklin, MD 01/04/23 [phone]

## 2023-01-04 NOTE — Discharge Instructions (Addendum)
You were seen today for chronic shoulder/arm pain and gout of the foot.  Your xray yesterday was normal.   This is likely tendon/ligament/rotator cuff in etiology and you need to follow up with your primary care provider for further work up.   I have given you a shot of toradol today for both shoulder pain and gout.  I have sent out a steroid pack, and a refill of colchicine as well.

## 2023-01-04 NOTE — ED Triage Notes (Signed)
Pt c/o joint pain to rt shoulder and elbow x2 months. States now unable to lift arm. Denies injury. Took ibuprofen yesterday.  Pt c/o lt heel pain since yesterday and feels like gout. States went to ED yesterday and left d/t wait time.

## 2023-04-25 ENCOUNTER — Emergency Department (HOSPITAL_COMMUNITY)
Admission: EM | Admit: 2023-04-25 | Discharge: 2023-04-26 | Disposition: A | Discharge status: Home / Self Care | Attending: Emergency Medicine | Admitting: Emergency Medicine

## 2023-04-25 ENCOUNTER — Emergency Department (HOSPITAL_COMMUNITY)

## 2023-04-25 ENCOUNTER — Emergency Department (HOSPITAL_BASED_OUTPATIENT_CLINIC_OR_DEPARTMENT_OTHER)

## 2023-04-25 ENCOUNTER — Other Ambulatory Visit: Payer: Self-pay

## 2023-04-25 ENCOUNTER — Encounter (HOSPITAL_COMMUNITY): Payer: Self-pay

## 2023-04-25 DIAGNOSIS — G8929 Other chronic pain: Secondary | ICD-10-CM

## 2023-04-25 DIAGNOSIS — M25511 Pain in right shoulder: Secondary | ICD-10-CM | POA: Insufficient documentation

## 2023-04-25 DIAGNOSIS — M79662 Pain in left lower leg: Secondary | ICD-10-CM | POA: Insufficient documentation

## 2023-04-25 DIAGNOSIS — M7989 Other specified soft tissue disorders: Secondary | ICD-10-CM

## 2023-04-25 NOTE — ED Provider Notes (Signed)
 [place] EMERGENCY DEPARTMENT AT [facility] Provider Note   CSN: [id number] Arrival date & time: 04/25/23  1720     History {Add pertinent medical, surgical, social history, OB history to HPI:1} Chief Complaint  Patient presents with   Shoulder Pain    Danny Kim is a 53 y.o. male.  The history is provided by the patient and medical records.  Shoulder Pain  53 year old male with history of hypertension, gout, history of DVT not currently on anticoagulation, presenting to the ED with multiple complaints.  Right shoulder pain--ongoing for several months now.  He has had x-rays of his right shoulder a few months ago that did not show anything acute.  States he still has a lot of pain whenever he lifts his arm up above his head.  He feels like something is "broken".  Does have a history of gout but states that feels very different and usually occurs in his feet.  He is right-hand dominant.  Denies any numbness or weakness of the right arm.  Left calf pain--ongoing x 3 days.  States is the same leg he had caught in a hole years ago and was concerned for same.  He has not noticed any redness or swelling.  He is not having any difficulty walking.  Home Medications Prior to Admission medications   Medication Sig Start Date End Date Taking? Authorizing Provider  amLODipine (NORVASC) 5 MG tablet TAKE 1 TABLET (5 MG TOTAL) BY MOUTH DAILY. 10/14/22   Hindel, Tacey Ruiz, MD  amLODipine (NORVASC) 5 MG tablet Take 1 tablet (5 mg total) by mouth daily. 12/22/22   Garrison, [place] N, FNP  colchicine 0.6 MG tablet Take 1 tablet (0.6 mg total) by mouth daily for 7 days. 01/04/23 01/11/23  Piontek, Denny Peon, MD  cyclobenzaprine (FLEXERIL) 10 MG tablet Take 1 tablet (10 mg total) by mouth 2 (two) times daily as needed for muscle spasms. 11/28/22   Karmen Stabs, MD  diphenhydrAMINE (BENADRYL) 25 MG tablet Take 1 tablet (25 mg total) by mouth every 6 (six) hours as needed. 12/22/22   Garrison, [place] N,  FNP  ibuprofen (ADVIL,MOTRIN) 200 MG tablet Take 400 mg by mouth daily as needed for mild pain.    [provider]  methylPREDNISolone (MEDROL DOSEPAK) 4 MG TBPK tablet Take as directed 01/04/23   Jannifer Franklin, MD  multivitamin-iron-minerals-folic acid (CENTRUM) chewable tablet Chew 1 tablet by mouth daily.    [provider]  oxyCODONE-acetaminophen (PERCOCET/ROXICET) 5-325 MG tablet Take 1 tablet by mouth every 6 (six) hours as needed for severe pain. Patient not taking: Reported on 07/08/2022 07/29/21   Horton, Mayer Masker, MD      Allergies    Bee venom    Review of Systems   Review of Systems  Musculoskeletal:  Positive for arthralgias.  All other systems reviewed and are negative.   Physical Exam Updated Vital Signs BP (!) 156/99 (BP Location: Right Arm)   Pulse 73   Temp 98 F (36.7 C)   Resp 16   Ht 5\' 5"  (1.651 m)   Wt 68 kg   SpO2 100%   BMI 24.96 kg/m  Physical Exam Vitals and nursing note reviewed.  Constitutional:      Appearance: He is well-developed.  HENT:     Head: Normocephalic and atraumatic.  Eyes:     Conjunctiva/sclera: Conjunctivae normal.     Pupils: Pupils are equal, round, and reactive to light.  Cardiovascular:     Rate and Rhythm: Normal  rate and regular rhythm.     Heart sounds: Normal heart sounds.  Pulmonary:     Effort: Pulmonary effort is normal. No respiratory distress.     Breath sounds: Normal breath sounds. No rhonchi.  Musculoskeletal:        General: Normal range of motion.     Cervical back: Normal range of motion.     Comments: No acute deformity of right shoulder present, able to range shoulder, seems to have some pain/discomfort when raising arm above head, radial pulse intact, normal grip strength, normal distal sensation Left calf normal in appearance without overlying erythema or induration, calf is nontender to palpation, there is no asymmetry, no palpable cord, DP pulse intact  Skin:    General: Skin is  warm and dry.  Neurological:     Mental Status: He is alert and oriented to person, place, and time.     ED Results / Procedures / Treatments   Labs (all labs ordered are listed, but only abnormal results are displayed) Labs Reviewed - No data to display  EKG None  Radiology VAS [place] LOWER EXTREMITY VENOUS (DVT) (ONLY [facility]) Result Date: 04/25/2023  Lower Venous DVT Study Patient Name:  Danny Kim   Date of Exam:   04/25/2023 Medical Rec #: [id number]  Accession #:    [id number] Date of Birth: July 20, 1970   Patient Gender: M Patient Age:   69 years Exam Location:  [facility] Procedure:      VAS [place] LOWER EXTREMITY VENOUS (DVT) Referring Phys: Almeta Monas COUNTRYMAN --------------------------------------------------------------------------------  Indications: Pain.  Risk Factors: DVT 06/18/18 left peroneal. Comparison Study: Prior negative bilateral LEV done 11/28/22 and prior negative                   left LEV done 06/23/21. 01/28/19, 06/29/18, Performing Technologist: Sherren Kerns RVS  Examination Guidelines: A complete evaluation includes B-mode imaging, spectral Doppler, color Doppler, and power Doppler as needed of all accessible portions of each vessel. Bilateral testing is considered an integral part of a complete examination. Limited examinations for reoccurring indications may be performed as noted. The reflux portion of the exam is performed with the patient in reverse Trendelenburg.  +-----+---------------+---------+-----------+----------+--------------+ RIGHTCompressibilityPhasicitySpontaneityPropertiesThrombus Aging +-----+---------------+---------+-----------+----------+--------------+ CFV  Full           Yes      Yes                                 +-----+---------------+---------+-----------+----------+--------------+ SFJ  Full                                                        +-----+---------------+---------+-----------+----------+--------------+    +---------+---------------+---------+-----------+----------+--------------+ LEFT     CompressibilityPhasicitySpontaneityPropertiesThrombus Aging +---------+---------------+---------+-----------+----------+--------------+ CFV      Full           Yes      Yes                                 +---------+---------------+---------+-----------+----------+--------------+ SFJ      Full                                                        +---------+---------------+---------+-----------+----------+--------------+  FV Prox  Full                                                        +---------+---------------+---------+-----------+----------+--------------+ FV Mid   Full                                                        +---------+---------------+---------+-----------+----------+--------------+ FV DistalFull                                                        +---------+---------------+---------+-----------+----------+--------------+ PFV      Full                                                        +---------+---------------+---------+-----------+----------+--------------+ POP      Full           Yes      Yes                                 +---------+---------------+---------+-----------+----------+--------------+ PTV      Full                                                        +---------+---------------+---------+-----------+----------+--------------+ PERO     Full                                                        +---------+---------------+---------+-----------+----------+--------------+ Gastroc  Full                                                        +---------+---------------+---------+-----------+----------+--------------+    Summary: RIGHT: - No evidence of common femoral vein obstruction.   LEFT: - There is no evidence of deep vein thrombosis in the lower extremity.  - No cystic structure found in the popliteal fossa.   *See table(s) above for measurements and observations.    Preliminary     Procedures Procedures  {Document cardiac monitor, telemetry assessment procedure when appropriate:1}  Medications Ordered in ED Medications - No data to display  ED Course/ Medical Decision Making/ A&P   {   Click here for ABCD2, HEART and other calculatorsREFRESH Note before signing :1}  Medical Decision Making Amount and/or Complexity of Data Reviewed Radiology: ordered.   ***  {Document critical care time when appropriate:1} {Document review of labs and clinical decision tools ie heart score, Chads2Vasc2 etc:1}  {Document your independent review of radiology images, and any outside records:1} {Document your discussion with family members, caretakers, and with consultants:1} {Document social determinants of health affecting pt's care:1} {Document your decision making why or why not admission, treatments were needed:1} Final Clinical Impression(s) / ED Diagnoses Final diagnoses:  None    Rx / DC Orders ED Discharge Orders     None

## 2023-04-25 NOTE — Progress Notes (Signed)
 VASCULAR LAB    Left lower extremity venous duplex has been performed.  See CV proc for preliminary results.  Relayed negative results to Dr. Doran Durand via secure chat  Rosezetta Schlatter, [facility], RVT 04/25/2023, 7:00 PM

## 2023-04-25 NOTE — ED Triage Notes (Addendum)
 Pt presents with R shoulder pain x 5 months. Denies injury. Pt went to UC and had a negative XR. He was Rx ibuprofen without relief.   Pt also reports L calf pain with swelling. Denies injury. Hx of DVT.

## 2023-04-26 MED ORDER — MELOXICAM 7.5 MG PO TABS: 15.0000 mg | ORAL_TABLET | Freq: Every day | ORAL | 0 refills | Status: DC

## 2023-04-26 NOTE — Discharge Instructions (Addendum)
 Your x-ray today was normal aside from some arthritic change. Since you have been having trouble over the past few months, I recommend that you follow-up with orthopedics. Call and schedule an appointment. Return here for new concerns.

## 2023-05-17 NOTE — Therapy (Signed)
 OUTPATIENT PHYSICAL THERAPY UPPER EXTREMITY EVALUATION   Patient Name: Danny Kim MRN: [id number] DOB:01-05-1971, 53 y.o., male Today's Date: 05/19/2023  END OF SESSION:  PT End of Session - 05/18/23 0807     Visit Number 1    Number of Visits 13    Date for PT Re-Evaluation 07/08/23    Authorization Type Self pay    PT Start Time 0801    PT Stop Time 0845    PT Time Calculation (min) 44 min    Activity Tolerance Patient limited by pain    Behavior During Therapy [facility] for tasks assessed/performed             Past Medical History:  Diagnosis Date   DVT (deep venous thrombosis) (HCC)    Generalized headaches    Gout    Hypertension    History reviewed. No pertinent surgical history. Patient Active Problem List   Diagnosis Date Noted   Dizziness 09/20/2022   Primary hypertension 04/29/2021   Mixed hyperlipidemia 04/29/2021   Mass of head 01/21/2020   Gout 01/21/2020    PCP: No PCP  REFERRING PROVIDER: Karyl Paget D, PA   REFERRING DIAG: M75.41 (ICD-10-CM) - Impingement syndrome of right shoulder   THERAPY DIAG:  Chronic right shoulder pain  Stiffness of right shoulder, not elsewhere classified  Rationale for Evaluation and Treatment: Rehabilitation  ONSET DATE: 6 months  SUBJECTIVE:                                                                                                                                                                                      SUBJECTIVE STATEMENT: Pt reports insidious onset of R shoulder pain 6 months ago. Pt notes pain associated primarily with R shoulder movements, but neck movements also bother to less of a degree. Pt states having an injection last week which helped his R shoulder pain a little bit. Tylenol  and ibuprofen  do not help  Hand dominance: Right  PERTINENT HISTORY: Gout  PAIN:  Are you having pain? Yes: NPRS scale: 6/10. Pain range the week prior to PT: 6-8/10 Pain location: R shoulder Pain  description: sharp Aggravating factors: Lifting, movement, sleeping on it, pulling up pants Relieving factors: Nothing  PRECAUTIONS: None  RED FLAGS: None   WEIGHT BEARING RESTRICTIONS: No  FALLS:  Has patient fallen in last 6 months? No  LIVING ENVIRONMENT: Lives with: lives with their family Lives in: House/apartment Able to access home  OCCUPATION: Not working, not sue if issues with gout will allow him to return to work. Used to work in [employer].  PLOF: Independent  PATIENT GOALS: Pain relief  NEXT MD VISIT: Unsure  OBJECTIVE:  Note: Objective measures were completed at Evaluation unless otherwise noted.  DIAGNOSTIC FINDINGS:  04/25/23 FINDINGS: Degenerative changes in the [facility] joint with joint space narrowing and spurring. Glenohumeral joint is maintained. No acute bony abnormality. Specifically, no fracture, subluxation, or dislocation. Soft tissues are intact.   IMPRESSION: Degenerative changes in the [facility] joint.  No acute bony abnormality  PATIENT SURVEYS :  Quick Dash 45/55=82%   COGNITION: Overall cognitive status: Within functional limits for tasks assessed     SENSATION: WFL  POSTURE: Forward head, rounded shoulders  UPPER EXTREMITY ROM:   PROMs were markedly limited by pain Active ROM Right eval Left eval  Shoulder flexion 80 P 120  Shoulder extension    Shoulder abduction    Shoulder adduction    Shoulder internal rotation R hip P T10  Shoulder external rotation Unable P T3  Elbow flexion    Elbow extension    Wrist flexion    Wrist extension    Wrist ulnar deviation    Wrist radial deviation    Wrist pronation    Wrist supination    P= concordant pain Blank rows = not tested)  UPPER EXTREMITY MMT:  In a neutral position all R shoulder movements demonstrated good strength with no overt weakness, but all movements were painful MMT Right eval Left eval  Shoulder flexion P   Shoulder extension P   Shoulder abduction P    Shoulder adduction P   Shoulder internal rotation P   Shoulder external rotation P   Middle trapezius    Lower trapezius    Elbow flexion    Elbow extension    Wrist flexion    Wrist extension    Wrist ulnar deviation    Wrist radial deviation    Wrist pronation    Wrist supination    Grip strength (lbs)    P= concordant pain (Blank rows = not tested)  SHOULDER SPECIAL TESTS: Impingement tests: Hawkins/Kennedy impingement test: negative Rotator cuff assessment: Empty can test: negative and Full can test: negative No overt weakness   JOINT MOBILITY TESTING:  Testing limited by pain not by adhesive capsulitis  PALPATION:  TTP throughout the R GH jt region                                                                                                                     TREATMENT DATE:  [facility] Adult PT Treatment:                                                DATE: 05/18/23 Therapeutic Exercise: Developed, instructed in, and pt completed therex as noted in HEP  Self Care: Cold pack periodically during the day 10-15 mins     PATIENT EDUCATION: Education details: Eval findings, POC, HEP, self care  Person educated: Patient and Child(ren) Education method: Explanation, Demonstration, Tactile cues, Verbal cues,  and Handouts Education comprehension: verbalized understanding, returned demonstration, verbal cues required, and tactile cues required  HOME EXERCISE PROGRAM: Access Code: [email or web address] URL: [email or web address] Date: 05/18/2023 Prepared by: Liborio Reeds  Exercises - Flexion-Extension Shoulder Pendulum with Table Support (Mirrored)  - 3 x daily - 7 x weekly - 1 sets - 10 reps  ASSESSMENT:  CLINICAL IMPRESSION: Patient is a 53 y.o. male who was seen today for physical therapy evaluation and treatment for M75.41 (ICD-10-CM) - Impingement syndrome of right shoulder. Pt presents with signs and symptoms consistent with referral. ROM and strength are limited  with all R shoulder motions. Worked on a HEP for pt, but pt was only able to tolerate a gentle pendulum ex. Recommend use of a cold pack periodically through out the day for pain management. Pt will benefit from skilled PT 2w6 to address impairments to optimize R shoulder function with less pain.    OBJECTIVE IMPAIRMENTS: decreased activity tolerance, decreased ROM, decreased strength, impaired UE functional use, and pain.   ACTIVITY LIMITATIONS: carrying, lifting, sleeping, bathing, toileting, dressing, hygiene/grooming, and caring for others  PARTICIPATION LIMITATIONS: meal prep, laundry, and driving  PERSONAL FACTORS: Past/current experiences and Time since onset of injury/illness/exacerbation are also affecting patient's functional outcome.   REHAB POTENTIAL: Good  CLINICAL DECISION MAKING: Evolving/moderate complexity  EVALUATION COMPLEXITY: Moderate  GOALS:  SHORT TERM GOALS = LTGs  LONG TERM GOALS: Target date: 07/08/23  Pt will be Ind in a final HEP to maintain achieved LOF  Baseline: started Goal status: INITIAL  2.  Increase R shoulder AROM to within 80% of the L for proper functional and as indication of decreased pain Baseline: see flow sheets Goal status: INITIAL  3.  Pt will demonstrate at least 4+/5 R shoulder strength to complete daily activites  Baseline: general good, but painful Goal status: INITIAL  4.  Pt will be able to lift a 3# weight to overhead shelve as demonstration of improved R UE function Baseline:  Goal status: INITIAL  5.  Pt will report 50% or greater improvement in R shoulder pain for improved function and QOL Baseline: 6-8/10 Goal status: INITIAL  6.  Pt's Quick dash score will improved to less than 50% as indication of improved function  Baseline: 82% Goal status: INITIAL  PLAN: PT FREQUENCY: 2x/week  PT DURATION: 8 weeks  PLANNED INTERVENTIONS: 97164- PT Re-evaluation, 97110-Therapeutic exercises, 97530- Therapeutic activity,  97112- Neuromuscular re-education, 97535- Self Care, 54098- Manual therapy, [id number]- Aquatic Therapy, J1914- Electrical stimulation (unattended), [phone]- Ionotophoresis 4mg /ml Dexamethasone , Patient/Family education, Taping, Dry Needling, Joint mobilization, Cryotherapy, and Moist heat  PLAN FOR NEXT SESSION: Assess response to HEP; progress therex as indicated; use of modalities, manual therapy; and TPDN as indicated.    Khali Albanese MS, PT 05/19/23 8:40 AM

## 2023-05-18 ENCOUNTER — Ambulatory Visit: Payer: Self-pay | Attending: Physician Assistant

## 2023-05-18 ENCOUNTER — Other Ambulatory Visit: Payer: Self-pay

## 2023-05-18 DIAGNOSIS — G8929 Other chronic pain: Secondary | ICD-10-CM | POA: Insufficient documentation

## 2023-05-18 DIAGNOSIS — M25511 Pain in right shoulder: Secondary | ICD-10-CM | POA: Insufficient documentation

## 2023-05-18 DIAGNOSIS — M25611 Stiffness of right shoulder, not elsewhere classified: Secondary | ICD-10-CM | POA: Insufficient documentation

## 2023-05-23 ENCOUNTER — Encounter: Payer: Self-pay | Admitting: Physical Therapy

## 2023-05-23 ENCOUNTER — Ambulatory Visit: Admitting: Physical Therapy

## 2023-05-23 DIAGNOSIS — M25611 Stiffness of right shoulder, not elsewhere classified: Secondary | ICD-10-CM

## 2023-05-23 DIAGNOSIS — G8929 Other chronic pain: Secondary | ICD-10-CM

## 2023-05-23 NOTE — Therapy (Signed)
 OUTPATIENT PHYSICAL THERAPY UPPER EXTREMITY EVALUATION   Patient Name: Danny Kim MRN: [id number] DOB:[birth year], 53 y.o., male Today's Date: 05/23/2023  END OF SESSION:  PT End of Session - 05/23/23 0730     Visit Number 2    Number of Visits 13    Date for PT Re-Evaluation 07/08/23    Authorization Type Self pay    PT Start Time 0730   pt late   PT Stop Time 0800    PT Time Calculation (min) 30 min             Past Medical History:  Diagnosis Date   DVT (deep venous thrombosis) (HCC)    Generalized headaches    Gout    Hypertension    History reviewed. No pertinent surgical history. Patient Active Problem List   Diagnosis Date Noted   Dizziness 09/20/2022   Primary hypertension 04/29/2021   Mixed hyperlipidemia 04/29/2021   Mass of head 01/21/2020   Gout 01/21/2020    PCP: No PCP  REFERRING PROVIDER: Karyl Paget D, PA   REFERRING DIAG: M75.41 (ICD-10-CM) - Impingement syndrome of right shoulder   THERAPY DIAG:  Chronic right shoulder pain  Stiffness of right shoulder, not elsewhere classified  Rationale for Evaluation and Treatment: Rehabilitation  ONSET DATE: 6 months  SUBJECTIVE:                                                                                                                                                                                      SUBJECTIVE STATEMENT: Pt reports his shoulder has slowly been healing with the exercise he was given (pendulum).    EVAL: Pt reports insidious onset of R shoulder pain 6 months ago. Pt notes pain associated primarily with R shoulder movements, but neck movements also bother to less of a degree. Pt states having an injection last week which helped his R shoulder pain a little bit. Tylenol  and ibuprofen  do not help  Hand dominance: Right  PERTINENT HISTORY: Gout  PAIN:  Are you having pain? Yes: NPRS scale: 5/10.  Pain location: R shoulder Pain description: sharp Aggravating factors:  Lifting, movement, sleeping on it, pulling up pants Relieving factors: Nothing  PRECAUTIONS: None  RED FLAGS: None   WEIGHT BEARING RESTRICTIONS: No  FALLS:  Has patient fallen in last 6 months? No  LIVING ENVIRONMENT: Lives with: lives with their family Lives in: House/apartment Able to access home  OCCUPATION: Not working, not sue if issues with gout will allow him to return to work. Used to work in [employer].  PLOF: Independent  PATIENT GOALS: Pain relief  NEXT MD VISIT: Unsure  OBJECTIVE:  Note: Objective measures  were completed at Evaluation unless otherwise noted.  DIAGNOSTIC FINDINGS:  04/25/23 FINDINGS: Degenerative changes in the [facility] joint with joint space narrowing and spurring. Glenohumeral joint is maintained. No acute bony abnormality. Specifically, no fracture, subluxation, or dislocation. Soft tissues are intact.   IMPRESSION: Degenerative changes in the [facility] joint.  No acute bony abnormality  PATIENT SURVEYS :  Quick Dash 45/55=82%   COGNITION: Overall cognitive status: Within functional limits for tasks assessed     SENSATION: WFL  POSTURE: Forward head, rounded shoulders  UPPER EXTREMITY ROM:   PROMs were markedly limited by pain Active ROM Right eval Left eval Right  05/23/23  Shoulder flexion 80 P 120 PROM 120 ARPM120  Shoulder extension     Shoulder abduction   PROM 120/ AROM 120   Shoulder adduction     Shoulder internal rotation R hip P T10 PROM 70 Reach right LB  Shoulder external rotation Unable P T3 PROM 80/ Reach T2  Elbow flexion     Elbow extension     Wrist flexion     Wrist extension     Wrist ulnar deviation     Wrist radial deviation     Wrist pronation     Wrist supination     P= concordant pain Blank rows = not tested)  UPPER EXTREMITY MMT:  In a neutral position all R shoulder movements demonstrated good strength with no overt weakness, but all movements were painful MMT Right eval Left eval  Shoulder  flexion P   Shoulder extension P   Shoulder abduction P   Shoulder adduction P   Shoulder internal rotation P   Shoulder external rotation P   Middle trapezius    Lower trapezius    Elbow flexion    Elbow extension    Wrist flexion    Wrist extension    Wrist ulnar deviation    Wrist radial deviation    Wrist pronation    Wrist supination    Grip strength (lbs)    P= concordant pain (Blank rows = not tested)  SHOULDER SPECIAL TESTS: Impingement tests: Hawkins/Kennedy impingement test: negative Rotator cuff assessment: Empty can test: negative and Full can test: negative No overt weakness   JOINT MOBILITY TESTING:  Testing limited by pain not by adhesive capsulitis  PALPATION:  TTP throughout the R GH jt region                                                                                                                     TREATMENT DATE:  [facility] Adult PT Treatment:                                                DATE: 05/23/23 Therapeutic Activity Supine chest press with dowel  Supine pullover with dowel  Supine ER AAROM with dowel S/L shoulder abduction  S/L shoulder ER  Scap retract standing with tactile and verbal cues Pendulum (pt using 15# weight at home) Seated OH pulleys  Scap Row with GTB  Updated HEP  Therapeutic Exercise PROM shoulder flexion, abduction, ER, IR- pt with difficulty relaxing     [facility] Adult PT Treatment:                                                DATE: 05/18/23 Therapeutic Exercise: Developed, instructed in, and pt completed therex as noted in HEP  Self Care: Cold pack periodically during the day 10-15 mins     PATIENT EDUCATION: Education details: Eval findings, POC, HEP, self care  Person educated: Patient and Child(ren) Education method: Explanation, Demonstration, Tactile cues, Verbal cues, and Handouts Education comprehension: verbalized understanding, returned demonstration, verbal cues required, and tactile cues  required  HOME EXERCISE PROGRAM: Access Code: [email or web address] URL: [email or web address] Date: 05/18/2023 Prepared by: Liborio Reeds  Exercises - Flexion-Extension Shoulder Pendulum with Table Support (Mirrored)  - 3 x daily - 7 x weekly - 1 sets - 10 reps  ASSESSMENT:  CLINICAL IMPRESSION: Pt reports compliance with HEP and subjective improvement in pain. He also demonstrates improved AROM and PROM. He is unable to relax for PROM. Able to progress HEP to AAROM and scap strength with min increase in pain. Will consider Ionto patch for lateral shoulder due to point tenderness.   EVAL: Patient is a 53 y.o. male who was seen today for physical therapy evaluation and treatment for M75.41 (ICD-10-CM) - Impingement syndrome of right shoulder. Pt presents with signs and symptoms consistent with referral. ROM and strength are limited with all R shoulder motions. Worked on a HEP for pt, but pt was only able to tolerate a gentle pendulum ex. Recommend use of a cold pack periodically through out the day for pain management. Pt will benefit from skilled PT 2w6 to address impairments to optimize R shoulder function with less pain.    OBJECTIVE IMPAIRMENTS: decreased activity tolerance, decreased ROM, decreased strength, impaired UE functional use, and pain.   ACTIVITY LIMITATIONS: carrying, lifting, sleeping, bathing, toileting, dressing, hygiene/grooming, and caring for others  PARTICIPATION LIMITATIONS: meal prep, laundry, and driving  PERSONAL FACTORS: Past/current experiences and Time since onset of injury/illness/exacerbation are also affecting patient's functional outcome.   REHAB POTENTIAL: Good  CLINICAL DECISION MAKING: Evolving/moderate complexity  EVALUATION COMPLEXITY: Moderate  GOALS:  SHORT TERM GOALS = LTGs  LONG TERM GOALS: Target date: 07/08/23  Pt will be Ind in a final HEP to maintain achieved LOF  Baseline: started Goal status: INITIAL  2.  Increase R shoulder  AROM to within 80% of the L for proper functional and as indication of decreased pain Baseline: see flow sheets Goal status: INITIAL  3.  Pt will demonstrate at least 4+/5 R shoulder strength to complete daily activites  Baseline: general good, but painful Goal status: INITIAL  4.  Pt will be able to lift a 3# weight to overhead shelve as demonstration of improved R UE function Baseline:  Goal status: INITIAL  5.  Pt will report 50% or greater improvement in R shoulder pain for improved function and QOL Baseline: 6-8/10 Goal status: INITIAL  6.  Pt's Quick dash score will improved to less than 50% as indication of improved function  Baseline: 82% Goal status: INITIAL  PLAN: PT FREQUENCY: 2x/week  PT DURATION: 8  weeks  PLANNED INTERVENTIONS: 97164- PT Re-evaluation, 97110-Therapeutic exercises, 97530- Therapeutic activity, [id number]- Neuromuscular re-education, 97535- Self Care, 95188- Manual therapy, [phone]- Aquatic Therapy, [phone]- Electrical stimulation (unattended), [phone]- Ionotophoresis 4mg /ml Dexamethasone , Patient/Family education, Taping, Dry Needling, Joint mobilization, Cryotherapy, and Moist heat  PLAN FOR NEXT SESSION: Assess response to HEP; progress therex as indicated; use of modalities, manual therapy; and TPDN as indicated.   Gasper Karst, PTA 05/23/23 9:00 AM Phone: [phone] Fax: [phone]

## 2023-05-25 NOTE — Therapy (Signed)
 OUTPATIENT PHYSICAL THERAPY UPPER EXTREMITY TREATMENT   Patient Name: Danny Kim MRN: [id number] DOB:[birth year], 53 y.o., male Today's Date: 05/26/2023  END OF SESSION:  PT End of Session - 05/26/23 0726     Visit Number 3    Number of Visits 13    Date for PT Re-Evaluation 07/08/23    Authorization Type Self pay    PT Start Time 0718    PT Stop Time 0800    PT Time Calculation (min) 42 min    Activity Tolerance Patient limited by pain    Behavior During Therapy [facility] for tasks assessed/performed              Past Medical History:  Diagnosis Date   DVT (deep venous thrombosis) (HCC)    Generalized headaches    Gout    Hypertension    History reviewed. No pertinent surgical history. Patient Active Problem List   Diagnosis Date Noted   Dizziness 09/20/2022   Primary hypertension 04/29/2021   Mixed hyperlipidemia 04/29/2021   Mass of head 01/21/2020   Gout 01/21/2020    PCP: No PCP  REFERRING PROVIDER: Karyl Paget D, PA   REFERRING DIAG: M75.41 (ICD-10-CM) - Impingement syndrome of right shoulder   THERAPY DIAG:  Chronic right shoulder pain  Stiffness of right shoulder, not elsewhere classified  Rationale for Evaluation and Treatment: Rehabilitation  ONSET DATE: 6 months  SUBJECTIVE:                                                                                                                                                                                      SUBJECTIVE STATEMENT: Pt reports his R shoulder pain is better. He is completing his HEP and using cold packs  EVAL: Pt reports insidious onset of R shoulder pain 6 months ago. Pt notes pain associated primarily with R shoulder movements, but neck movements also bother to less of a degree. Pt states having an injection last week which helped his R shoulder pain a little bit. Tylenol  and ibuprofen  do not help  Hand dominance: Right  PERTINENT HISTORY: Gout  PAIN:  Are you having pain? Yes: NPRS  scale: 3/10.  Pain location: R shoulder Pain description: sharp Aggravating factors: Lifting, movement, sleeping on it, pulling up pants Relieving factors: Nothing  PRECAUTIONS: None  RED FLAGS: None   WEIGHT BEARING RESTRICTIONS: No  FALLS:  Has patient fallen in last 6 months? No  LIVING ENVIRONMENT: Lives with: lives with their family Lives in: House/apartment Able to access home  OCCUPATION: Not working, not sue if issues with gout will allow him to return to work. Used to work in [employer].  PLOF:  Independent  PATIENT GOALS: Pain relief  NEXT MD VISIT: Unsure  OBJECTIVE:  Note: Objective measures were completed at Evaluation unless otherwise noted.  DIAGNOSTIC FINDINGS:  04/25/23 FINDINGS: Degenerative changes in the [facility] joint with joint space narrowing and spurring. Glenohumeral joint is maintained. No acute bony abnormality. Specifically, no fracture, subluxation, or dislocation. Soft tissues are intact.   IMPRESSION: Degenerative changes in the [facility] joint.  No acute bony abnormality  PATIENT SURVEYS :  Quick Dash 45/55=82%   COGNITION: Overall cognitive status: Within functional limits for tasks assessed     SENSATION: WFL  POSTURE: Forward head, rounded shoulders  UPPER EXTREMITY ROM:   PROMs were markedly limited by pain Active ROM Right eval Left eval Right  05/23/23  Shoulder flexion 80 P 120 PROM 120 ARPM120  Shoulder extension     Shoulder abduction   PROM 120/ AROM 120   Shoulder adduction     Shoulder internal rotation R hip P T10 PROM 70 Reach right LB  Shoulder external rotation Unable P T3 PROM 80/ Reach T2  Elbow flexion     Elbow extension     Wrist flexion     Wrist extension     Wrist ulnar deviation     Wrist radial deviation     Wrist pronation     Wrist supination     P= concordant pain Blank rows = not tested)  UPPER EXTREMITY MMT:  In a neutral position all R shoulder movements demonstrated good strength with no  overt weakness, but all movements were painful MMT Right eval Left eval  Shoulder flexion P   Shoulder extension P   Shoulder abduction P   Shoulder adduction P   Shoulder internal rotation P   Shoulder external rotation P   Middle trapezius    Lower trapezius    Elbow flexion    Elbow extension    Wrist flexion    Wrist extension    Wrist ulnar deviation    Wrist radial deviation    Wrist pronation    Wrist supination    Grip strength (lbs)    P= concordant pain (Blank rows = not tested)  SHOULDER SPECIAL TESTS: Impingement tests: Hawkins/Kennedy impingement test: negative Rotator cuff assessment: Empty can test: negative and Full can test: negative No overt weakness   JOINT MOBILITY TESTING:  Testing limited by pain not by adhesive capsulitis  PALPATION:  TTP throughout the R GH jt region                                                                                                                     TREATMENT DATE:  [facility] Adult PT Treatment:                                                DATE: 05/26/23 Therapeutic Activity Supine chest press with dowel  Supine pullover with dowel  Supine ER AAROM with dowel S/L shoulder abduction 2x10 1# S/L shoulder ER 2x10 1# Pendulum 5# Seated OH pulleys  Scap Row with GTB 2x10 Shoulder Ext GTB 2x10 Modalities: Iontophoresis 4 mg/ml dexamethasone  6 hours Advised not to get patch wet  [facility] Adult PT Treatment:                                                DATE: 05/23/23 Therapeutic Activity Supine chest press with dowel  Supine pullover with dowel  Supine ER AAROM with dowel S/L shoulder abduction  S/L shoulder ER  Scap retract standing with tactile and verbal cues Pendulum (pt using 15# weight at home) Seated OH pulleys  Scap Row with GTB Updated HEP  Therapeutic Exercise PROM shoulder flexion, abduction, ER, IR- pt with difficulty relaxing   [facility] Adult PT Treatment:                                                 DATE: 05/18/23 Therapeutic Exercise: Developed, instructed in, and pt completed therex as noted in HEP  Self Care: Cold pack periodically during the day 10-15 mins     PATIENT EDUCATION: Education details: Eval findings, POC, HEP, self care  Person educated: Patient and Child(ren) Education method: Explanation, Demonstration, Tactile cues, Verbal cues, and Handouts Education comprehension: verbalized understanding, returned demonstration, verbal cues required, and tactile cues required  HOME EXERCISE PROGRAM: Access Code: [email or web address] URL: [email or web address] Date: 05/26/2023 Prepared by: Liborio Reeds  Exercises - Flexion-Extension Shoulder Pendulum with Table Support (Mirrored)  - 3 x daily - 7 x weekly - 1 sets - 10 reps - Supine chest press  - 1 x daily - 7 x weekly - 3 sets - 10 reps - Supine shoulder Flexion Extension AAROM with Dowel to comfortable height overhead  - 1 x daily - 7 x weekly - 1-2 sets - 10 reps - Supine Shoulder External Rotation with Dowel  - 1 x daily - 7 x weekly - 1 sets - 10 reps - Standing Shoulder Row with Anchored Resistance  - 1 x daily - 7 x weekly - 1-2 sets - 10 reps  ASSESSMENT:  CLINICAL IMPRESSION: PT was completed for AAROM, AROM, strengthening exercises.for the R shoulder. Pt tolerated prescribed exercises today without adverse effects Iontophoresis was then provided to the R lateral GH jt area to address pain. AROM of the R shoulder is much improved. Pt will continue to benefit from skilled PT to address impairments for improved R shoulder function with less pain.  EVAL: Patient is a 53 y.o. male who was seen today for physical therapy evaluation and treatment for M75.41 (ICD-10-CM) - Impingement syndrome of right shoulder. Pt presents with signs and symptoms consistent with referral. ROM and strength are limited with all R shoulder motions. Worked on a HEP for pt, but pt was only able to tolerate a gentle pendulum ex. Recommend use  of a cold pack periodically through out the day for pain management. Pt will benefit from skilled PT 2w6 to address impairments to optimize R shoulder function with less pain.    OBJECTIVE IMPAIRMENTS: decreased activity tolerance, decreased ROM, decreased strength, impaired UE functional use, and pain.  ACTIVITY LIMITATIONS: carrying, lifting, sleeping, bathing, toileting, dressing, hygiene/grooming, and caring for others  PARTICIPATION LIMITATIONS: meal prep, laundry, and driving  PERSONAL FACTORS: Past/current experiences and Time since onset of injury/illness/exacerbation are also affecting patient's functional outcome.   REHAB POTENTIAL: Good  CLINICAL DECISION MAKING: Evolving/moderate complexity  EVALUATION COMPLEXITY: Moderate  GOALS:  SHORT TERM GOALS = LTGs  LONG TERM GOALS: Target date: 07/08/23  Pt will be Ind in a final HEP to maintain achieved LOF  Baseline: started Goal status: INITIAL  2.  Increase R shoulder AROM to within 80% of the L for proper functional and as indication of decreased pain Baseline: see flow sheets Goal status: INITIAL  3.  Pt will demonstrate at least 4+/5 R shoulder strength to complete daily activites  Baseline: general good, but painful Goal status: INITIAL  4.  Pt will be able to lift a 3# weight to overhead shelve as demonstration of improved R UE function Baseline:  Goal status: INITIAL  5.  Pt will report 50% or greater improvement in R shoulder pain for improved function and QOL Baseline: 6-8/10 Goal status: INITIAL  6.  Pt's Quick dash score will improved to less than 50% as indication of improved function  Baseline: 82% Goal status: INITIAL  PLAN: PT FREQUENCY: 2x/week  PT DURATION: 8 weeks  PLANNED INTERVENTIONS: 97164- PT Re-evaluation, 97110-Therapeutic exercises, 97530- Therapeutic activity, 97112- Neuromuscular re-education, 97535- Self Care, 47829- Manual therapy, [id number]- Aquatic Therapy, F6213- Electrical  stimulation (unattended), 97033- Ionotophoresis 4mg /ml Dexamethasone , Patient/Family education, Taping, Dry Needling, Joint mobilization, Cryotherapy, and Moist heat  PLAN FOR NEXT SESSION: Assess response to HEP; progress therex as indicated; use of modalities, manual therapy; and TPDN as indicated.   Mikenzi Raysor MS, PT 05/26/23 8:05 AM

## 2023-05-26 ENCOUNTER — Ambulatory Visit

## 2023-05-26 ENCOUNTER — Ambulatory Visit: Attending: Physician Assistant

## 2023-05-26 DIAGNOSIS — M25511 Pain in right shoulder: Secondary | ICD-10-CM | POA: Insufficient documentation

## 2023-05-26 DIAGNOSIS — M25611 Stiffness of right shoulder, not elsewhere classified: Secondary | ICD-10-CM | POA: Insufficient documentation

## 2023-05-26 DIAGNOSIS — G8929 Other chronic pain: Secondary | ICD-10-CM | POA: Insufficient documentation

## 2023-05-30 ENCOUNTER — Encounter: Payer: Self-pay | Admitting: Physical Therapy

## 2023-05-30 ENCOUNTER — Ambulatory Visit: Admitting: Physical Therapy

## 2023-05-30 DIAGNOSIS — G8929 Other chronic pain: Secondary | ICD-10-CM

## 2023-05-30 DIAGNOSIS — M25611 Stiffness of right shoulder, not elsewhere classified: Secondary | ICD-10-CM

## 2023-05-30 NOTE — Therapy (Signed)
 OUTPATIENT PHYSICAL THERAPY UPPER EXTREMITY TREATMENT   Patient Name: Danny Kim MRN: [id number] DOB:[birth year], 53 y.o., male Today's Date: 05/30/2023  END OF SESSION:  PT End of Session - 05/30/23 1007     Visit Number 4    Number of Visits 13    Date for PT Re-Evaluation 07/08/23    Authorization Type Self pay    PT Start Time 0930    PT Stop Time 1008    PT Time Calculation (min) 38 min               Past Medical History:  Diagnosis Date   DVT (deep venous thrombosis) (HCC)    Generalized headaches    Gout    Hypertension    History reviewed. No pertinent surgical history. Patient Active Problem List   Diagnosis Date Noted   Dizziness 09/20/2022   Primary hypertension 04/29/2021   Mixed hyperlipidemia 04/29/2021   Mass of head 01/21/2020   Gout 01/21/2020    PCP: No PCP  REFERRING PROVIDER: Karyl Paget D, PA   REFERRING DIAG: M75.41 (ICD-10-CM) - Impingement syndrome of right shoulder   THERAPY DIAG:  Chronic right shoulder pain  Stiffness of right shoulder, not elsewhere classified  Rationale for Evaluation and Treatment: Rehabilitation  ONSET DATE: 6 months  SUBJECTIVE:                                                                                                                                                                                      SUBJECTIVE STATEMENT: Pt reports his R shoulder pain is better. He reports improvement with ionto patch last visit.   EVAL: Pt reports insidious onset of R shoulder pain 6 months ago. Pt notes pain associated primarily with R shoulder movements, but neck movements also bother to less of a degree. Pt states having an injection last week which helped his R shoulder pain a little bit. Tylenol  and ibuprofen  do not help  Hand dominance: Right  PERTINENT HISTORY: Gout  PAIN:  Are you having pain? Yes: NPRS scale: 3/10.  Pain location: R shoulder Pain description: sharp Aggravating factors: Lifting,  movement, sleeping on it, pulling up pants Relieving factors: Nothing  PRECAUTIONS: None  RED FLAGS: None   WEIGHT BEARING RESTRICTIONS: No  FALLS:  Has patient fallen in last 6 months? No  LIVING ENVIRONMENT: Lives with: lives with their family Lives in: House/apartment Able to access home  OCCUPATION: Not working, not sue if issues with gout will allow him to return to work. Used to work in [employer].  PLOF: Independent  PATIENT GOALS: Pain relief  NEXT MD VISIT: Unsure  OBJECTIVE:  Note: Objective measures were  completed at Evaluation unless otherwise noted.  DIAGNOSTIC FINDINGS:  04/25/23 FINDINGS: Degenerative changes in the [facility] joint with joint space narrowing and spurring. Glenohumeral joint is maintained. No acute bony abnormality. Specifically, no fracture, subluxation, or dislocation. Soft tissues are intact.   IMPRESSION: Degenerative changes in the [facility] joint.  No acute bony abnormality  PATIENT SURVEYS :  Quick Dash 45/55=82%   COGNITION: Overall cognitive status: Within functional limits for tasks assessed     SENSATION: WFL  POSTURE: Forward head, rounded shoulders  UPPER EXTREMITY ROM:   PROMs were markedly limited by pain Active ROM Right eval Left eval Right  05/23/23 Right 05/30/23  Shoulder flexion 80 P 120 PROM 120 ARPM120 155  Shoulder extension      Shoulder abduction   PROM 120/ AROM 120  155  Shoulder adduction      Shoulder internal rotation R hip P T10 PROM 70 Reach right [facility] LB  Shoulder external rotation Unable P T3 PROM 80/ Reach T2 T2  Elbow flexion      Elbow extension      Wrist flexion      Wrist extension      Wrist ulnar deviation      Wrist radial deviation      Wrist pronation      Wrist supination      P= concordant pain Blank rows = not tested)  UPPER EXTREMITY MMT:  In a neutral position all R shoulder movements demonstrated good strength with no overt weakness, but all movements were painful MMT  Right eval Left eval  Shoulder flexion P   Shoulder extension P   Shoulder abduction P   Shoulder adduction P   Shoulder internal rotation P   Shoulder external rotation P   Middle trapezius    Lower trapezius    Elbow flexion    Elbow extension    Wrist flexion    Wrist extension    Wrist ulnar deviation    Wrist radial deviation    Wrist pronation    Wrist supination    Grip strength (lbs)    P= concordant pain (Blank rows = not tested)  SHOULDER SPECIAL TESTS: Impingement tests: Hawkins/Kennedy impingement test: negative Rotator cuff assessment: Empty can test: negative and Full can test: negative No overt weakness   JOINT MOBILITY TESTING:  Testing limited by pain not by adhesive capsulitis  PALPATION:  TTP throughout the R GH jt region                                                                                                                     TREATMENT DATE:  [facility] Adult PT Treatment:                                                DATE: 05/30/23 Therapeutic Exercise: R ER red band 10 x 2  R IR red band 10 x 2   Therapeutic Activity: UBE 2 min each direction,  L2  OH pulleys  Scapular rows and ext GTB  S/L shoulder abdct AROM -increased pain Supine chest press with dowel  Supine pullover with dowel  Supine horiz abdct with dowel  Modalities: Iontophoresis 4 mg/ml dexamethasone  6 hours     [facility] Adult PT Treatment:                                                DATE: 05/26/23 Therapeutic Activity Supine chest press with dowel  Supine pullover with dowel  Supine ER AAROM with dowel S/L shoulder abduction 2x10 1# S/L shoulder ER 2x10 1# Pendulum 5# Seated OH pulleys  Scap Row with GTB 2x10 Shoulder Ext GTB 2x10 Modalities: Iontophoresis 4 mg/ml dexamethasone  6 hours Advised not to get patch wet  [facility] Adult PT Treatment:                                                DATE: 05/23/23 Therapeutic Activity Supine chest press with dowel  Supine  pullover with dowel  Supine ER AAROM with dowel S/L shoulder abduction  S/L shoulder ER  Scap retract standing with tactile and verbal cues Pendulum (pt using 15# weight at home) Seated OH pulleys  Scap Row with GTB Updated HEP  Therapeutic Exercise PROM shoulder flexion, abduction, ER, IR- pt with difficulty relaxing   [facility] Adult PT Treatment:                                                DATE: 05/18/23 Therapeutic Exercise: Developed, instructed in, and pt completed therex as noted in HEP  Self Care: Cold pack periodically during the day 10-15 mins     PATIENT EDUCATION: Education details: Eval findings, POC, HEP, self care  Person educated: Patient and Child(ren) Education method: Explanation, Demonstration, Tactile cues, Verbal cues, and Handouts Education comprehension: verbalized understanding, returned demonstration, verbal cues required, and tactile cues required  HOME EXERCISE PROGRAM: Access Code: [email or web address] URL: [email or web address] Date: 05/26/2023 Prepared by: Liborio Reeds  Exercises - Flexion-Extension Shoulder Pendulum with Table Support (Mirrored)  - 3 x daily - 7 x weekly - 1 sets - 10 reps - Supine chest press  - 1 x daily - 7 x weekly - 3 sets - 10 reps - Supine shoulder Flexion Extension AAROM with Dowel to comfortable height overhead  - 1 x daily - 7 x weekly - 1-2 sets - 10 reps - Supine Shoulder External Rotation with Dowel  - 1 x daily - 7 x weekly - 1 sets - 10 reps - Standing Shoulder Row with Anchored Resistance  - 1 x daily - 7 x weekly - 1-2 sets - 10 reps  ASSESSMENT:  CLINICAL IMPRESSION: Pt reports improvement. He does report increased pain after last session but also improvement later and felt like the ionto patch was helpful. AROM of the R shoulder is much improved. He reports min stiffness remains with IR reach behind back. He did have increased pain with Side lying shoulder abduction. He tolerated  shoulder ER/IR strengthening  well. Will assess tolerance to this visit and consider updating HEP next session.  Pt will continue to benefit from skilled PT to address impairments for improved R shoulder function with less pain. Will consider IR stretch at next visit as well.   EVAL: Patient is a 53 y.o. male who was seen today for physical therapy evaluation and treatment for M75.41 (ICD-10-CM) - Impingement syndrome of right shoulder. Pt presents with signs and symptoms consistent with referral. ROM and strength are limited with all R shoulder motions. Worked on a HEP for pt, but pt was only able to tolerate a gentle pendulum ex. Recommend use of a cold pack periodically through out the day for pain management. Pt will benefit from skilled PT 2w6 to address impairments to optimize R shoulder function with less pain.    OBJECTIVE IMPAIRMENTS: decreased activity tolerance, decreased ROM, decreased strength, impaired UE functional use, and pain.   ACTIVITY LIMITATIONS: carrying, lifting, sleeping, bathing, toileting, dressing, hygiene/grooming, and caring for others  PARTICIPATION LIMITATIONS: meal prep, laundry, and driving  PERSONAL FACTORS: Past/current experiences and Time since onset of injury/illness/exacerbation are also affecting patient's functional outcome.   REHAB POTENTIAL: Good  CLINICAL DECISION MAKING: Evolving/moderate complexity  EVALUATION COMPLEXITY: Moderate  GOALS:  SHORT TERM GOALS = LTGs  LONG TERM GOALS: Target date: 07/08/23  Pt will be Ind in a final HEP to maintain achieved LOF  Baseline: started Goal status: INITIAL  2.  Increase R shoulder AROM to within 80% of the L for proper functional and as indication of decreased pain Baseline: see flow sheets Goal status: INITIAL  3.  Pt will demonstrate at least 4+/5 R shoulder strength to complete daily activites  Baseline: general good, but painful Goal status: INITIAL  4.  Pt will be able to lift a 3# weight to overhead shelve as  demonstration of improved R UE function Baseline:  Goal status: INITIAL  5.  Pt will report 50% or greater improvement in R shoulder pain for improved function and QOL Baseline: 6-8/10 Goal status: INITIAL  6.  Pt's Quick dash score will improved to less than 50% as indication of improved function  Baseline: 82% Goal status: INITIAL  PLAN: PT FREQUENCY: 2x/week  PT DURATION: 8 weeks  PLANNED INTERVENTIONS: 97164- PT Re-evaluation, 97110-Therapeutic exercises, 97530- Therapeutic activity, 97112- Neuromuscular re-education, 97535- Self Care, 16109- Manual therapy, [id number]- Aquatic Therapy, U0454- Electrical stimulation (unattended), [id number]- Ionotophoresis 4mg /ml Dexamethasone , Patient/Family education, Taping, Dry Needling, Joint mobilization, Cryotherapy, and Moist heat  PLAN FOR NEXT SESSION: Assess response to HEP; progress therex as indicated; use of modalities, manual therapy; and TPDN as indicated.   Gasper Karst, PTA 05/30/23 10:10 AM Phone: [phone] Fax: [phone]

## 2023-05-30 NOTE — Therapy (Unsigned)
 OUTPATIENT PHYSICAL THERAPY UPPER EXTREMITY TREATMENT   Patient Name: Danny Kim MRN: [id number] DOB:Apr 06, 1970, 53 y.o., male Today's Date: 06/01/2023  END OF SESSION:  PT End of Session - 06/01/23 0748     Visit Number 5    Number of Visits 13    Date for PT Re-Evaluation 07/08/23    Authorization Type Self pay    PT Start Time 0800    PT Stop Time 0840    PT Time Calculation (min) 40 min    Activity Tolerance Patient limited by pain;Patient tolerated treatment well    Behavior During Therapy [facility] for tasks assessed/performed                Past Medical History:  Diagnosis Date   DVT (deep venous thrombosis) (HCC)    Generalized headaches    Gout    Hypertension    History reviewed. No pertinent surgical history. Patient Active Problem List   Diagnosis Date Noted   Dizziness 09/20/2022   Primary hypertension 04/29/2021   Mixed hyperlipidemia 04/29/2021   Mass of head 01/21/2020   Gout 01/21/2020    PCP: No PCP  REFERRING PROVIDER: Karyl Paget D, PA   REFERRING DIAG: M75.41 (ICD-10-CM) - Impingement syndrome of right shoulder   THERAPY DIAG:  Chronic right shoulder pain  Stiffness of right shoulder, not elsewhere classified  Rationale for Evaluation and Treatment: Rehabilitation  ONSET DATE: 6 months  SUBJECTIVE:                                                                                                                                                                                      SUBJECTIVE STATEMENT: Rt shoulder pain is improved, he is doing his home program.   EVAL: Pt reports insidious onset of R shoulder pain 6 months ago. Pt notes pain associated primarily with R shoulder movements, but neck movements also bother to less of a degree. Pt states having an injection last week which helped his R shoulder pain a little bit. Tylenol  and ibuprofen  do not help  Hand dominance: Right  PERTINENT HISTORY: Gout  PAIN:  Are you having pain?  Yes: NPRS scale: 4/10.  Pain location: R shoulder Pain description: sharp Aggravating factors: Lifting, movement, sleeping on it, pulling up pants Relieving factors: Nothing  PRECAUTIONS: None  RED FLAGS: None   WEIGHT BEARING RESTRICTIONS: No  FALLS:  Has patient fallen in last 6 months? No  LIVING ENVIRONMENT: Lives with: lives with their family Lives in: House/apartment Able to access home  OCCUPATION: Not working, not sure if issues with gout will allow him to return to work. Used to work in [employer].  PLOF:  Independent  PATIENT GOALS: Pain relief  NEXT MD VISIT: Unsure  OBJECTIVE:  Note: Objective measures were completed at Evaluation unless otherwise noted.  DIAGNOSTIC FINDINGS:  04/25/23 FINDINGS: Degenerative changes in the [facility] joint with joint space narrowing and spurring. Glenohumeral joint is maintained. No acute bony abnormality. Specifically, no fracture, subluxation, or dislocation. Soft tissues are intact.   IMPRESSION: Degenerative changes in the [facility] joint.  No acute bony abnormality  PATIENT SURVEYS :  Quick Dash 45/55=82%   COGNITION: Overall cognitive status: Within functional limits for tasks assessed     SENSATION: WFL  POSTURE: Forward head, rounded shoulders  UPPER EXTREMITY ROM:   PROMs were markedly limited by pain Active ROM Right eval Left eval Right  05/23/23 Right 05/30/23  Shoulder flexion 80 P 120 PROM 120 ARPM120 155  Shoulder extension      Shoulder abduction   PROM 120/ AROM 120  155  Shoulder adduction      Shoulder internal rotation R hip P T10 PROM 70 Reach right [facility]  Shoulder external rotation Unable P T3 PROM 80/ Reach T2 T2  Elbow flexion      Elbow extension      Wrist flexion      Wrist extension      Wrist ulnar deviation      Wrist radial deviation      Wrist pronation      Wrist supination      P= concordant pain Blank rows = not tested)  UPPER EXTREMITY MMT:  In a neutral position all  R shoulder movements demonstrated good strength with no overt weakness, but all movements were painful MMT Right eval Left eval Rt 06/01/23  Shoulder flexion P    Shoulder extension P    Shoulder abduction P    Shoulder adduction P    Shoulder internal rotation P  4+/5  Shoulder external rotation P  4+/5  Middle trapezius     Lower trapezius     Elbow flexion     Elbow extension     Wrist flexion     Wrist extension     Wrist ulnar deviation     Wrist radial deviation     Wrist pronation     Wrist supination     Grip strength (lbs)     P= concordant pain (Blank rows = not tested)  SHOULDER SPECIAL TESTS: Impingement tests: Hawkins/Kennedy impingement test: negative Rotator cuff assessment: Empty can test: negative and Full can test: negative No overt weakness   JOINT MOBILITY TESTING:  Testing limited by pain not by adhesive capsulitis  PALPATION:  TTP throughout the R GH jt region                                                                                                                     TREATMENT DATE:   [facility] Adult PT Treatment:  DATE: 06/01/23 Manual Therapy: Distraction to Rt UE in abduction  Therapeutic Activity: UBE 3 min each direction,  L1 Standing UE ranger flexion, scaption Dowel for AAROM overhead  Scap Row with GTB 2x10 Shoulder Ext GTB 2x10 Sidelying 2 lbs reverse fly x 2 x 10  Sidelying scaption 2 lbs 2 x 10  Supine narrow press 2 lbs x 10  Supine arm circles 2 lbs x 10 each direction  Modalities: Iontophoresis 4 mg/ml dexamethasone  6 hours Rt lateral deltoid 1 cc   [facility] Adult PT Treatment:                                                DATE: 05/30/23 Therapeutic Exercise: R ER red band 10 x 2  R IR red band 10 x 2   Therapeutic Activity: UBE 2 min each direction,  L2  OH pulleys  Scapular rows and ext GTB  S/L shoulder abdct AROM -increased pain Supine chest press with dowel  Supine  pullover with dowel  Supine horiz abdct with dowel  Modalities: Iontophoresis 4 mg/ml dexamethasone  6 hours     [facility] Adult PT Treatment:                                                DATE: 05/26/23 Therapeutic Activity Supine chest press with dowel  Supine pullover with dowel  Supine ER AAROM with dowel S/L shoulder abduction 2x10 1# S/L shoulder ER 2x10 1# Pendulum 5# Seated OH pulleys  Scap Row with GTB 2x10 Shoulder Ext GTB 2x10 Modalities: Iontophoresis 4 mg/ml dexamethasone  6 hours Advised not to get patch wet  PATIENT EDUCATION: Education details: Eval findings, POC, HEP, self care  Person educated: Patient and Child(ren) Education method: Explanation, Demonstration, Tactile cues, Verbal cues, and Handouts Education comprehension: verbalized understanding, returned demonstration, verbal cues required, and tactile cues required  HOME EXERCISE PROGRAM: Access Code: [email or web address] URL: [email or web address] Date: 05/26/2023 Prepared by: Liborio Reeds  Exercises - Flexion-Extension Shoulder Pendulum with Table Support (Mirrored)  - 3 x daily - 7 x weekly - 1 sets - 10 reps - Supine chest press  - 1 x daily - 7 x weekly - 3 sets - 10 reps - Supine shoulder Flexion Extension AAROM with Dowel to comfortable height overhead  - 1 x daily - 7 x weekly - 1-2 sets - 10 reps - Supine Shoulder External Rotation with Dowel  - 1 x daily - 7 x weekly - 1 sets - 10 reps - Standing Shoulder Row with Anchored Resistance  - 1 x daily - 7 x weekly - 1-2 sets - 10 reps  ASSESSMENT:  CLINICAL IMPRESSION: Patient with moderate pain before session, able to exercise within limited of pain with AAROM and gentle strength exercises.  Crepitus with reaching into abduction and with passive internal rotation.  Patient is noting improvement in functional use of his Rt UE and has been able to report less pain at rest , ADLs.   Pt will benefit from skilled PT to address impairments to optimize R  shoulder function with less pain.    EVAL: Patient is a 53 y.o. male who was seen today for physical therapy evaluation and treatment for M75.41 (ICD-10-CM) - Impingement syndrome of  right shoulder. Pt presents with signs and symptoms consistent with referral. ROM and strength are limited with all R shoulder motions. Worked on a HEP for pt, but pt was only able to tolerate a gentle pendulum ex. Recommend use of a cold pack periodically through out the day for pain management. Pt will benefit from skilled PT 2w6 to address impairments to optimize R shoulder function with less pain.    OBJECTIVE IMPAIRMENTS: decreased activity tolerance, decreased ROM, decreased strength, impaired UE functional use, and pain.   ACTIVITY LIMITATIONS: carrying, lifting, sleeping, bathing, toileting, dressing, hygiene/grooming, and caring for others  PARTICIPATION LIMITATIONS: meal prep, laundry, and driving  PERSONAL FACTORS: Past/current experiences and Time since onset of injury/illness/exacerbation are also affecting patient's functional outcome.   REHAB POTENTIAL: Good  CLINICAL DECISION MAKING: Evolving/moderate complexity  EVALUATION COMPLEXITY: Moderate  GOALS:  SHORT TERM GOALS = LTGs  LONG TERM GOALS: Target date: 07/08/23  Pt will be Ind in a final HEP to maintain achieved LOF  Baseline: started Goal status: ongoing   2.  Increase R shoulder AROM to within 80% of the L for proper functional and as indication of decreased pain Baseline: see flow sheets Goal status: ongoing   3.  Pt will demonstrate at least 4+/5 R shoulder strength to complete daily activites  Baseline: general good, but painful Goal status: INITIAL  4.  Pt will be able to lift a 3# weight to overhead shelf as demonstration of improved R UE function Baseline:  Goal status: INITIAL  5.  Pt will report 50% or greater improvement in R shoulder pain for improved function and QOL Baseline: 6-8/10 Goal status: INITIAL  6.   Pt's Quick dash score will improved to less than 50% as indication of improved function  Baseline: 82% Goal status: INITIAL  PLAN: PT FREQUENCY: 2x/week  PT DURATION: 8 weeks  PLANNED INTERVENTIONS: 97164- PT Re-evaluation, 97110-Therapeutic exercises, 97530- Therapeutic activity, 97112- Neuromuscular re-education, 97535- Self Care, [id number]- Manual therapy, [id number]- Aquatic Therapy, [id number]- Electrical stimulation (unattended), [phone]- Ionotophoresis 4mg /ml Dexamethasone , Patient/Family education, Taping, Dry Needling, Joint mobilization, Cryotherapy, and Moist heat  PLAN FOR NEXT SESSION: progress HEP; posture with reaching, cues for scapular position, use of modalities, manual therapy; and TPDN as indicated.   Marci Setter, PT 06/01/23 8:44 AM Phone: [phone] Fax: [phone]

## 2023-06-01 ENCOUNTER — Encounter: Payer: Self-pay | Admitting: Physical Therapy

## 2023-06-01 ENCOUNTER — Ambulatory Visit: Admitting: Physical Therapy

## 2023-06-01 DIAGNOSIS — M25611 Stiffness of right shoulder, not elsewhere classified: Secondary | ICD-10-CM

## 2023-06-01 DIAGNOSIS — G8929 Other chronic pain: Secondary | ICD-10-CM

## 2023-06-09 NOTE — Therapy (Addendum)
 OUTPATIENT PHYSICAL THERAPY TREATMENT NOTE/DISCHARGE  PHYSICAL THERAPY DISCHARGE SUMMARY  Visits from Start of Care: 6  Current functional level related to goals / functional outcomes: See goals/objective   Remaining deficits: Unable to assess   Education / Equipment: HEP   Patient agrees to discharge. Patient goals were unable to assess. Patient is being discharged due to not returning since the last visit.     Patient Name: Danny Kim MRN: [id number] DOB:10/18/1970, 53 y.o., male Today's Date: 06/10/2023  END OF SESSION:  PT End of Session - 06/10/23 0801     Visit Number 6    Number of Visits 13    Date for PT Re-Evaluation 07/08/23    Authorization Type Self pay    PT Start Time 0800    PT Stop Time 0839    PT Time Calculation (min) 39 min    Activity Tolerance Patient limited by pain;Patient tolerated treatment well    Behavior During Therapy [facility] for tasks assessed/performed                 Past Medical History:  Diagnosis Date   DVT (deep venous thrombosis) (HCC)    Generalized headaches    Gout    Hypertension    History reviewed. No pertinent surgical history. Patient Active Problem List   Diagnosis Date Noted   Dizziness 09/20/2022   Primary hypertension 04/29/2021   Mixed hyperlipidemia 04/29/2021   Mass of head 01/21/2020   Gout 01/21/2020    PCP: No PCP  REFERRING PROVIDER: Danton Leas D, PA   REFERRING DIAG: M75.41 (ICD-10-CM) - Impingement syndrome of right shoulder   THERAPY DIAG:  Chronic right shoulder pain  Stiffness of right shoulder, not elsewhere classified  Rationale for Evaluation and Treatment: Rehabilitation  ONSET DATE: 6 months  SUBJECTIVE:                                                                                                                                                                                      SUBJECTIVE STATEMENT: Pt presents to PT with reports of continued R shoulder pain. Notes  that it was feeling worse yesterday.   EVAL: Pt reports insidious onset of R shoulder pain 6 months ago. Pt notes pain associated primarily with R shoulder movements, but neck movements also bother to less of a degree. Pt states having an injection last week which helped his R shoulder pain a little bit. Tylenol  and ibuprofen  do not help  Hand dominance: Right  PERTINENT HISTORY: Gout  PAIN:  Are you having pain? Yes: NPRS scale: 4/10.  Pain location: R shoulder Pain description: sharp Aggravating factors: Lifting, movement, sleeping on it,  pulling up pants Relieving factors: Nothing  PRECAUTIONS: None  RED FLAGS: None   WEIGHT BEARING RESTRICTIONS: No  FALLS:  Has patient fallen in last 6 months? No  LIVING ENVIRONMENT: Lives with: lives with their family Lives in: House/apartment Able to access home  OCCUPATION: Not working, not sure if issues with gout will allow him to return to work. Used to work in [employer] representative.  PLOF: Independent  PATIENT GOALS: Pain relief  NEXT MD VISIT: Unsure  OBJECTIVE:  Note: Objective measures were completed at Evaluation unless otherwise noted.  DIAGNOSTIC FINDINGS:  04/25/23 FINDINGS: Degenerative changes in the [facility] joint with joint space narrowing and spurring. Glenohumeral joint is maintained. No acute bony abnormality. Specifically, no fracture, subluxation, or dislocation. Soft tissues are intact.   IMPRESSION: Degenerative changes in the [facility] joint.  No acute bony abnormality  PATIENT SURVEYS :  Quick Dash 45/55=82%   COGNITION: Overall cognitive status: Within functional limits for tasks assessed     SENSATION: WFL  POSTURE: Forward head, rounded shoulders  UPPER EXTREMITY ROM:   PROMs were markedly limited by pain Active ROM Right eval Left eval Right  05/23/23 Right 05/30/23  Shoulder flexion 80 P 120 PROM 120 ARPM120 155  Shoulder extension      Shoulder abduction   PROM 120/ AROM 120  155  Shoulder  adduction      Shoulder internal rotation R hip P T10 PROM 70 Reach right [facility]  Shoulder external rotation Unable P T3 PROM 80/ Reach T2 T2  Elbow flexion      Elbow extension      Wrist flexion      Wrist extension      Wrist ulnar deviation      Wrist radial deviation      Wrist pronation      Wrist supination      P= concordant pain Blank rows = not tested)  UPPER EXTREMITY MMT:  In a neutral position all R shoulder movements demonstrated good strength with no overt weakness, but all movements were painful MMT Right eval Left eval Rt 06/01/23  Shoulder flexion P    Shoulder extension P    Shoulder abduction P    Shoulder adduction P    Shoulder internal rotation P  4+/5  Shoulder external rotation P  4+/5  Middle trapezius     Lower trapezius     Elbow flexion     Elbow extension     Wrist flexion     Wrist extension     Wrist ulnar deviation     Wrist radial deviation     Wrist pronation     Wrist supination     Grip strength (lbs)     P= concordant pain (Blank rows = not tested)  SHOULDER SPECIAL TESTS: Impingement tests: Hawkins/Kennedy impingement test: negative Rotator cuff assessment: Empty can test: negative and Full can test: negative No overt weakness   JOINT MOBILITY TESTING:  Testing limited by pain not by adhesive capsulitis  PALPATION:  TTP throughout the R GH jt region  TREATMENT DATE:   [facility] Adult PT Treatment:                                                DATE: 06/10/23 Therapeutic Activity: UBE 2 min each direction L1.5 for functional activity tolerance Supine horizontal abd 2x10 GTB Supine shoulder flexion 2x10 R Supine arm circles 2 lbs x 1 each direction  Sidelying shoulder abduction 2x10 2# Standing row with GTB 2x12 Shoulder Ext GTB 2x10 R shoulder ER 2x10 YTB R upper trap strech  2x30 Modalities: Iontophoresis 4 mg/ml dexamethasone  6 hours Rt lateral deltoid 1 cc  [facility] Adult PT Treatment:                                                DATE: 06/01/23 Manual Therapy: Distraction to Rt UE in abduction  Therapeutic Activity: UBE 3 min each direction,  L1 Standing UE ranger flexion, scaption Dowel for AAROM overhead  Scap Row with GTB 2x10 Shoulder Ext GTB 2x10 Sidelying 2 lbs reverse fly x 2 x 10  Sidelying scaption 2 lbs 2 x 10  Supine narrow press 2 lbs x 10  Supine arm circles 2 lbs x 10 each direction  Modalities: Iontophoresis 4 mg/ml dexamethasone  6 hours Rt lateral deltoid 1 cc   PATIENT EDUCATION: Education details: Eval findings, POC, HEP, self care  Person educated: Patient and Child(ren) Education method: Explanation, Demonstration, Tactile cues, Verbal cues, and Handouts Education comprehension: verbalized understanding, returned demonstration, verbal cues required, and tactile cues required  HOME EXERCISE PROGRAM: Access Code: [email or web address] URL: [email or web address] Date: 05/26/2023 Prepared by: Dasie Daft  Exercises - Flexion-Extension Shoulder Pendulum with Table Support (Mirrored)  - 3 x daily - 7 x weekly - 1 sets - 10 reps - Supine chest press  - 1 x daily - 7 x weekly - 3 sets - 10 reps - Supine shoulder Flexion Extension AAROM with Dowel to comfortable height overhead  - 1 x daily - 7 x weekly - 1-2 sets - 10 reps - Supine Shoulder External Rotation with Dowel  - 1 x daily - 7 x weekly - 1 sets - 10 reps - Standing Shoulder Row with Anchored Resistance  - 1 x daily - 7 x weekly - 1-2 sets - 10 reps  ASSESSMENT:  CLINICAL IMPRESSION: Pt was able to complete all prescribed exercises with no adverse effect. Exercises continued to progress functional activity and OH reach, focus on improving RTC stability and updated HEP with upper trap stretch.   EVAL: Patient is a 53 y.o. male who was seen today for physical therapy  evaluation and treatment for M75.41 (ICD-10-CM) - Impingement syndrome of right shoulder. Pt presents with signs and symptoms consistent with referral. ROM and strength are limited with all R shoulder motions. Worked on a HEP for pt, but pt was only able to tolerate a gentle pendulum ex. Recommend use of a cold pack periodically through out the day for pain management. Pt will benefit from skilled PT 2w6 to address impairments to optimize R shoulder function with less pain.    OBJECTIVE IMPAIRMENTS: decreased activity tolerance, decreased ROM, decreased strength, impaired UE functional use, and pain.   ACTIVITY LIMITATIONS: carrying, lifting, sleeping, bathing, toileting, dressing, hygiene/grooming,  and caring for others  PARTICIPATION LIMITATIONS: meal prep, laundry, and driving  PERSONAL FACTORS: Past/current experiences and Time since onset of injury/illness/exacerbation are also affecting patient's functional outcome.   REHAB POTENTIAL: Good  CLINICAL DECISION MAKING: Evolving/moderate complexity  EVALUATION COMPLEXITY: Moderate  GOALS:  SHORT TERM GOALS = LTGs  LONG TERM GOALS: Target date: 07/08/23  Pt will be Ind in a final HEP to maintain achieved LOF  Baseline: started Goal status: ongoing   2.  Increase R shoulder AROM to within 80% of the L for proper functional and as indication of decreased pain Baseline: see flow sheets Goal status: ongoing   3.  Pt will demonstrate at least 4+/5 R shoulder strength to complete daily activites  Baseline: general good, but painful Goal status: INITIAL  4.  Pt will be able to lift a 3# weight to overhead shelf as demonstration of improved R UE function Baseline:  Goal status: INITIAL  5.  Pt will report 50% or greater improvement in R shoulder pain for improved function and QOL Baseline: 6-8/10 Goal status: INITIAL  6.  Pt's Quick dash score will improved to less than 50% as indication of improved function  Baseline: 82% Goal  status: INITIAL  PLAN: PT FREQUENCY: 2x/week  PT DURATION: 8 weeks  PLANNED INTERVENTIONS: 97164- PT Re-evaluation, 97110-Therapeutic exercises, 97530- Therapeutic activity, 97112- Neuromuscular re-education, 97535- Self Care, [id number]- Manual therapy, [id number]- Aquatic Therapy, [id number]- Electrical stimulation (unattended), [phone]- Ionotophoresis 4mg /ml Dexamethasone , Patient/Family education, Taping, Dry Needling, Joint mobilization, Cryotherapy, and Moist heat  PLAN FOR NEXT SESSION: progress HEP; posture with reaching, cues for scapular position, use of modalities, manual therapy; and TPDN as indicated.   Alm JAYSON Kingdom PT  06/10/23 8:41 AM

## 2023-06-10 ENCOUNTER — Ambulatory Visit: Payer: Self-pay

## 2023-06-10 DIAGNOSIS — M25611 Stiffness of right shoulder, not elsewhere classified: Secondary | ICD-10-CM

## 2023-06-10 DIAGNOSIS — G8929 Other chronic pain: Secondary | ICD-10-CM

## 2023-06-17 ENCOUNTER — Telehealth: Payer: Self-pay | Admitting: Physical Therapy

## 2023-06-17 ENCOUNTER — Ambulatory Visit: Payer: Self-pay | Admitting: Physical Therapy

## 2023-06-17 NOTE — Telephone Encounter (Signed)
 Contacted patient regarding no show. Spoke with Wife who states that he forgot. Relayed message that he has no more scheduled appointments, please have him call to schedule more.

## 2023-07-13 ENCOUNTER — Ambulatory Visit (HOSPITAL_COMMUNITY)
Admission: EM | Admit: 2023-07-13 | Discharge: 2023-07-13 | Disposition: A | Discharge status: Home / Self Care | Payer: Self-pay | Attending: Physician Assistant | Admitting: Physician Assistant

## 2023-07-13 ENCOUNTER — Encounter (HOSPITAL_COMMUNITY): Payer: Self-pay

## 2023-07-13 DIAGNOSIS — M109 Gout, unspecified: Secondary | ICD-10-CM

## 2023-07-13 MED ORDER — COLCHICINE 0.6 MG PO TABS: 0.6000 mg | ORAL_TABLET | Freq: Every day | ORAL | 0 refills | Status: DC

## 2023-07-13 MED ORDER — KETOROLAC TROMETHAMINE 30 MG/ML IJ SOLN
INTRAMUSCULAR | Status: AC
  Filled 2023-07-13: qty 1

## 2023-07-13 MED ORDER — KETOROLAC TROMETHAMINE 30 MG/ML IJ SOLN
30.0000 mg | Freq: Once | INTRAMUSCULAR | Status: AC
  Administered 2023-07-13: 30 mg via INTRAMUSCULAR

## 2023-07-13 NOTE — Discharge Instructions (Signed)
  Start Colchine tomorrow for treatment of Gout flare. Follow up if no gradual improvement or with any further concerns.   Avoid ibuprofen  or other NSAID medication today following injection in office.

## 2023-07-13 NOTE — ED Provider Notes (Signed)
 [facility]    CSN: [id number] Arrival date & time: 07/13/23  0803      History   Chief Complaint Chief Complaint  Patient presents with   Foot Pain    HPI Danny Kim is a 53 y.o. male.   Patient presents today for evaluation of left foot pain that started last night. Pain is present to left 1st MTP with swelling and tenderness. Weight bearing worsens pain. He has had gout flares in this location in the past. He reports eating cherries last night and is not sure if this is what triggered reaction. He reports he did take a daily medication for gout in the past but has not been taking as he has ran out of prescription.   The history is provided by the patient.  Foot Pain Pertinent negatives include no shortness of breath.    Past Medical History:  Diagnosis Date   DVT (deep venous thrombosis) (HCC)    Generalized headaches    Gout    Hypertension     Patient Active Problem List   Diagnosis Date Noted   Dizziness 09/20/2022   Primary hypertension 04/29/2021   Mixed hyperlipidemia 04/29/2021   Mass of head 01/21/2020   Gout 01/21/2020    History reviewed. No pertinent surgical history.     Home Medications    Prior to Admission medications   Medication Sig Start Date End Date Taking? Authorizing Provider  amLODipine  (NORVASC ) 5 MG tablet TAKE 1 TABLET (5 MG TOTAL) BY MOUTH DAILY. 10/14/22   Hindel, Jim Motts, MD  amLODipine  (NORVASC ) 5 MG tablet Take 1 tablet (5 mg total) by mouth daily. 12/22/22   Harlow Lighter, [place]  N, FNP  colchicine  0.6 MG tablet Take 1 tablet (0.6 mg total) by mouth daily for 7 days. 07/13/23 07/20/23  Vernestine Gondola, PA-C  cyclobenzaprine  (FLEXERIL ) 10 MG tablet Take 1 tablet (10 mg total) by mouth 2 (two) times daily as needed for muscle spasms. 11/28/22   Madelaine Scarpa, MD  diphenhydrAMINE  (BENADRYL ) 25 MG tablet Take 1 tablet (25 mg total) by mouth every 6 (six) hours as needed. 12/22/22   Harlow Lighter, [place]  N, FNP  ibuprofen  (ADVIL ,MOTRIN )  200 MG tablet Take 400 mg by mouth daily as needed for mild pain.    [provider]  meloxicam  (MOBIC ) 7.5 MG tablet Take 2 tablets (15 mg total) by mouth daily. 04/26/23   Coretha Dew, PA-C  methylPREDNISolone  (MEDROL  DOSEPAK) 4 MG TBPK tablet Take as directed 01/04/23   Lesle Ras, MD  multivitamin-iron-minerals-folic acid (CENTRUM) chewable tablet Chew 1 tablet by mouth daily.    [provider]  oxyCODONE -acetaminophen  (PERCOCET/ROXICET) 5-325 MG tablet Take 1 tablet by mouth every 6 (six) hours as needed for severe pain. Patient not taking: Reported on 07/08/2022 07/29/21   Horton, Vonzella Guernsey, MD    Family History History reviewed. No pertinent family history.  Social History Social History   Tobacco Use   Smoking status: Never    Passive exposure: Never   Smokeless tobacco: Never  Vaping Use   Vaping status: Never Used  Substance Use Topics   Alcohol use: No   Drug use: No     Allergies   Bee venom   Review of Systems Review of Systems  Constitutional:  Negative for chills and fever.  Eyes:  Negative for discharge and redness.  Respiratory:  Negative for shortness of breath.   Musculoskeletal:  Positive for arthralgias and joint swelling.  Neurological:  Negative for numbness.  Physical Exam Triage Vital Signs ED Triage Vitals  Encounter Vitals Group     BP 07/13/23 0815 (!) 137/117     Girls Systolic BP Percentile --      Girls Diastolic BP Percentile --      Boys Systolic BP Percentile --      Boys Diastolic BP Percentile --      Pulse Rate 07/13/23 0814 83     Resp 07/13/23 0814 16     Temp 07/13/23 0814 97.9 F (36.6 C)     Temp Source 07/13/23 0814 Oral     SpO2 07/13/23 0814 97 %     Weight --      Height --      Head Circumference --      Peak Flow --      Pain Score 07/13/23 0816 9     Pain Loc --      Pain Education --      Exclude from Growth Chart --    No data found.  Updated Vital Signs BP (!) 137/117 (BP  Location: Right Arm)   Pulse 83   Temp 97.9 F (36.6 C) (Oral)   Resp 16   SpO2 97%   Visual Acuity Right Eye Distance:   Left Eye Distance:   Bilateral Distance:    Right Eye Near:   Left Eye Near:    Bilateral Near:     Physical Exam Vitals and nursing note reviewed.  Constitutional:      General: He is not in acute distress.    Appearance: Normal appearance. He is not ill-appearing.  HENT:     Head: Normocephalic and atraumatic.   Eyes:     Conjunctiva/sclera: Conjunctivae normal.    Cardiovascular:     Rate and Rhythm: Normal rate.  Pulmonary:     Effort: Pulmonary effort is normal. No respiratory distress.   Musculoskeletal:     Comments: Diffuse swelling, mild erythema and TTP to left 1st MTP. Decreased ROM of left 1st toe.    Skin:    Capillary Refill: Normal cap refill to left great toe  Neurological:     Mental Status: He is alert.     Comments: Gross sensation intact to left toes distally  Psychiatric:        Mood and Affect: Mood normal.        Behavior: Behavior normal.        Thought Content: Thought content normal.      UC Treatments / Results  Labs (all labs ordered are listed, but only abnormal results are displayed) Labs Reviewed - No data to display  EKG   Radiology No results found.  Procedures Procedures (including critical care time)  Medications Ordered in UC Medications  ketorolac  (TORADOL ) 30 MG/ML injection 30 mg (30 mg Intramuscular Given 07/13/23 0830)    Initial Impression / Assessment and Plan / UC Course  I have reviewed the triage vital signs and the nursing notes.  Pertinent labs & imaging results that were available during my care of the patient were reviewed by me and considered in my medical decision making (see chart for details).    Symptoms consistent with gout flare. Patient requests injection in office- has not taken any medication for pain today. Toradol  administered and advised he start colchicine   tomorrow. Discussed started daily medication for gout prevention after flare resolves to reduce risk of worsening symptoms and advised he follow up with PCP for same.   Final Clinical Impressions(s) /  UC Diagnoses   Final diagnoses:  Acute gout involving toe of left foot, unspecified cause     Discharge Instructions       Start Colchine tomorrow for treatment of Gout flare. Follow up if no gradual improvement or with any further concerns.   Avoid ibuprofen  or other NSAID medication today following injection in office.     ED Prescriptions     Medication Sig Dispense Auth. Provider   colchicine  0.6 MG tablet Take 1 tablet (0.6 mg total) by mouth daily for 7 days. 7 tablet Vernestine Gondola, PA-C      PDMP not reviewed this encounter.   Vernestine Gondola, PA-C 07/13/23 1017

## 2023-07-13 NOTE — ED Triage Notes (Signed)
 Pt states left foot pain since last night. History of gout.

## 2023-07-14 ENCOUNTER — Encounter: Payer: Self-pay | Admitting: Family Medicine

## 2023-07-14 ENCOUNTER — Ambulatory Visit (INDEPENDENT_AMBULATORY_CARE_PROVIDER_SITE_OTHER): Payer: Self-pay | Admitting: Family Medicine

## 2023-07-14 VITALS — BP 137/95 | HR 75 | Ht 65.0 in

## 2023-07-14 DIAGNOSIS — M1A9XX0 Chronic gout, unspecified, without tophus (tophi): Secondary | ICD-10-CM

## 2023-07-14 MED ORDER — NAPROXEN 500 MG PO TABS: 500.0000 mg | ORAL_TABLET | Freq: Two times a day (BID) | ORAL | 0 refills | Status: DC

## 2023-07-14 MED ORDER — COLCHICINE 0.6 MG PO TABS: 0.6000 mg | ORAL_TABLET | Freq: Every day | ORAL | 0 refills | Status: DC

## 2023-07-14 NOTE — Patient Instructions (Signed)
 It was wonderful to see you today!  You were seen for your acute gout flare.  We prescribed a medicine called Naprosyn  which she will take twice a day for the next 14-15 days.  If your flare does not resolve with taking this medication please come back in as this may not be a gout flare.  When your flare is resolved you should follow-up with your primary care doctor to discuss long-term management of this condition as there are medications that can help prevent gout flares.  You should follow-up in about 2 weeks.   Please call [phone] with any questions about today's appointment.   If you need any additional refills, please call your pharmacy before calling the office.  Rayma Calandra, DO Family Medicine

## 2023-07-14 NOTE — Assessment & Plan Note (Signed)
-   Begin Naprosyn  500 mg twice daily for 15 days -Follow-up with PCP in about 2 weeks to discuss long-term management obtain uric acid -Urine precautions including continued pain and discomfort beyond course of treatment discussed with patient

## 2023-07-14 NOTE — Progress Notes (Signed)
    SUBJECTIVE:   CHIEF COMPLAINT / HPI:   Gout flare- seen in UC yesterday, got a shot of toradol , started on colchicine  0.6.  Still in pain, this is his typical flare pattern.  No fevers or chills reported.  Is using crutches to offload the affected foot.  PERTINENT  PMH / PSH: Gout, hypertension  OBJECTIVE:   BP (!) 137/95   Pulse 75   Ht 5' 5 (1.651 m)   SpO2 100%   BMI 24.96 kg/m   General: Well-appearing gentleman with no distress MSK: Grossly swollen and erythematous right great toe.  Acutely tender to light palpation.  Pedal pulses present bilaterally.  No swelling or abnormality of left foot or proximal joints to the affected joint.  ASSESSMENT/PLAN:   Assessment & Plan Chronic gout involving toe of left foot without tophus, unspecified cause - Begin Naprosyn  500 mg twice daily for 15 days -Follow-up with PCP in about 2 weeks to discuss long-term management obtain uric acid -Urine precautions including continued pain and discomfort beyond course of treatment discussed with patient   Rayma Calandra, DO [facility]

## 2023-08-12 ENCOUNTER — Encounter: Payer: Self-pay | Admitting: Advanced Practice Midwife

## 2023-09-09 ENCOUNTER — Other Ambulatory Visit: Payer: Self-pay

## 2023-09-09 ENCOUNTER — Other Ambulatory Visit: Payer: Self-pay | Admitting: Family Medicine

## 2023-09-09 ENCOUNTER — Emergency Department (HOSPITAL_COMMUNITY)
Admission: EM | Admit: 2023-09-09 | Discharge: 2023-09-10 | Disposition: A | Discharge status: Home / Self Care | Attending: Emergency Medicine | Admitting: Emergency Medicine

## 2023-09-09 ENCOUNTER — Encounter (HOSPITAL_COMMUNITY): Payer: Self-pay | Admitting: *Deleted

## 2023-09-09 DIAGNOSIS — M542 Cervicalgia: Secondary | ICD-10-CM | POA: Insufficient documentation

## 2023-09-09 DIAGNOSIS — I10 Essential (primary) hypertension: Secondary | ICD-10-CM

## 2023-09-09 MED ORDER — OXYCODONE-ACETAMINOPHEN 5-325 MG PO TABS
ORAL_TABLET | ORAL | Status: AC
  Filled 2023-09-09: qty 1

## 2023-09-09 MED ORDER — OXYCODONE-ACETAMINOPHEN 5-325 MG PO TABS
1.0000 | ORAL_TABLET | Freq: Once | ORAL | Status: AC
  Administered 2023-09-09: 1 via ORAL

## 2023-09-09 NOTE — ED Triage Notes (Signed)
 The pt is c/o lt sided headache since 2145  he was given ibu around there same time

## 2023-09-10 ENCOUNTER — Emergency Department (HOSPITAL_COMMUNITY)

## 2023-09-10 LAB — CBC WITH DIFFERENTIAL/PLATELET
Abs Immature Granulocytes: 0.04 K/uL (ref 0.00–0.07)
Basophils Absolute: 0.1 K/uL (ref 0.0–0.1)
Basophils Relative: 1 %
Eosinophils Absolute: 0.6 K/uL — ABNORMAL HIGH (ref 0.0–0.5)
Eosinophils Relative: 8 %
HCT: 46.9 % (ref 39.0–52.0)
Hemoglobin: 14.1 g/dL (ref 13.0–17.0)
Immature Granulocytes: 1 %
Lymphocytes Relative: 27 %
Lymphs Abs: 2.1 K/uL (ref 0.7–4.0)
MCH: 22 pg — ABNORMAL LOW (ref 26.0–34.0)
MCHC: 30.1 g/dL (ref 30.0–36.0)
MCV: 73.3 fL — ABNORMAL LOW (ref 80.0–100.0)
Monocytes Absolute: 0.8 K/uL (ref 0.1–1.0)
Monocytes Relative: 10 %
Neutro Abs: 4.1 K/uL (ref 1.7–7.7)
Neutrophils Relative %: 53 %
Platelets: 258 K/uL (ref 150–400)
RBC: 6.4 MIL/uL — ABNORMAL HIGH (ref 4.22–5.81)
RDW: 14.9 % (ref 11.5–15.5)
WBC: 7.7 K/uL (ref 4.0–10.5)
nRBC: 0 % (ref 0.0–0.2)

## 2023-09-10 LAB — I-STAT CHEM 8, ED
BUN: 23 mg/dL — ABNORMAL HIGH (ref 6–20)
Calcium, Ion: 1.26 mmol/L (ref 1.15–1.40)
Chloride: 103 mmol/L (ref 98–111)
Creatinine, Ser: 1.1 mg/dL (ref 0.61–1.24)
Glucose, Bld: 96 mg/dL (ref 70–99)
HCT: 47 % (ref 39.0–52.0)
Hemoglobin: 16 g/dL (ref 13.0–17.0)
Potassium: 3.7 mmol/L (ref 3.5–5.1)
Sodium: 141 mmol/L (ref 135–145)
TCO2: 25 mmol/L (ref 22–32)

## 2023-09-10 LAB — COMPREHENSIVE METABOLIC PANEL WITH GFR
ALT: 34 U/L (ref 0–44)
AST: 30 U/L (ref 15–41)
Albumin: 4.3 g/dL (ref 3.5–5.0)
Alkaline Phosphatase: 96 U/L (ref 38–126)
Anion gap: 13 (ref 5–15)
BUN: 22 mg/dL — ABNORMAL HIGH (ref 6–20)
CO2: 23 mmol/L (ref 22–32)
Calcium: 9.8 mg/dL (ref 8.9–10.3)
Chloride: 102 mmol/L (ref 98–111)
Creatinine, Ser: 1.04 mg/dL (ref 0.61–1.24)
GFR, Estimated: >60 mL/min (ref >60)
Glucose, Bld: 96 mg/dL (ref 70–99)
Potassium: 3.8 mmol/L (ref 3.5–5.1)
Sodium: 138 mmol/L (ref 135–145)
Total Bilirubin: 0.9 mg/dL (ref 0.0–1.2)
Total Protein: 7.7 g/dL (ref 6.5–8.1)

## 2023-09-10 MED ORDER — KETOROLAC TROMETHAMINE 15 MG/ML IJ SOLN
15.0000 mg | Freq: Once | INTRAMUSCULAR | Status: AC
  Administered 2023-09-10: 15 mg via INTRAVENOUS
  Filled 2023-09-10: qty 1

## 2023-09-10 MED ORDER — IOHEXOL 350 MG/ML SOLN
75.0000 mL | Freq: Once | INTRAVENOUS | Status: AC | PRN
  Administered 2023-09-10: 75 mL via INTRAVENOUS

## 2023-09-10 NOTE — ED Provider Notes (Signed)
 [place] EMERGENCY DEPARTMENT AT [facility] Provider Note   CSN: [id number] Arrival date & time: 09/09/23  2328     Patient presents with: Headache   Danny Kim is a 53 y.o. male.   53 year old male with prior medical history as detailed below presents for evaluation.  Patient reports acute onset of left sided posterior neck pain.  This is worse with movement.  He reports that he was reaching for something on the floor and felt the pain began.  The pain began around 10 PM last night.  It has been persistent since.  He reports that the pain radiates from his left posterior neck into his left posterior scalp.  He reports that the pain was very bad last night he had trouble seeing for several seconds.  He did not take anything at home for his symptoms.  He was given an dose of Percocet in triage.  He reports this has helped somewhat with his pain.  He has waited 9 hours for evaluation given lengthy wait times.  He reports that his neck still hurts.  Otherwise his symptoms have not evolved or changed.  The history is provided by the patient and medical records.       Prior to Admission medications   Medication Sig Start Date End Date Taking? Authorizing Provider  amLODipine  (NORVASC ) 5 MG tablet TAKE 1 TABLET (5 MG TOTAL) BY MOUTH DAILY. 09/09/23   Adele Song, MD  colchicine  0.6 MG tablet Take 1 tablet (0.6 mg total) by mouth daily for 7 days. 07/14/23 07/21/23  Cleotilde Lukes, DO  cyclobenzaprine  (FLEXERIL ) 10 MG tablet Take 1 tablet (10 mg total) by mouth 2 (two) times daily as needed for muscle spasms. 11/28/22   Tobi Fellows, MD  diphenhydrAMINE  (BENADRYL ) 25 MG tablet Take 1 tablet (25 mg total) by mouth every 6 (six) hours as needed. 12/22/22   Dreama, [place]  N, FNP  ibuprofen  (ADVIL ,MOTRIN ) 200 MG tablet Take 400 mg by mouth daily as needed for mild pain.    [provider]  meloxicam  (MOBIC ) 7.5 MG tablet Take 2 tablets (15 mg total) by mouth daily. 04/26/23    Jarold Olam HERO, PA-C  methylPREDNISolone  (MEDROL  DOSEPAK) 4 MG TBPK tablet Take as directed 01/04/23   Darral Longs, MD  multivitamin-iron-minerals-folic acid (CENTRUM) chewable tablet Chew 1 tablet by mouth daily.    [provider]  naproxen  (NAPROSYN ) 500 MG tablet Take 1 tablet (500 mg total) by mouth 2 (two) times daily with a meal. 07/14/23   Cleotilde Lukes, DO  oxyCODONE -acetaminophen  (PERCOCET/ROXICET) 5-325 MG tablet Take 1 tablet by mouth every 6 (six) hours as needed for severe pain. Patient not taking: Reported on 07/08/2022 07/29/21   Horton, Charmaine FALCON, MD    Allergies: Bee venom    Review of Systems  All other systems reviewed and are negative.   Updated Vital Signs BP (!) 155/103 (BP Location: Right Arm)   Pulse 64   Temp (!) 97.4 F (36.3 C)   Resp 16   Ht 5' 5 (1.651 m)   Wt 68 kg   SpO2 97%   BMI 24.95 kg/m   Physical Exam Vitals and nursing note reviewed.  Constitutional:      General: He is not in acute distress.    Appearance: Normal appearance. He is well-developed.  HENT:     Head: Normocephalic and atraumatic.  Eyes:     General: No visual field deficit.    Conjunctiva/sclera: Conjunctivae normal.  Pupils: Pupils are equal, round, and reactive to light.  Neck:     Comments: Palpable muscle spasm in the left posterior neck musculature.  Maximal tenderness with palpation to this area. Cardiovascular:     Rate and Rhythm: Normal rate and regular rhythm.     Heart sounds: Normal heart sounds.  Pulmonary:     Effort: Pulmonary effort is normal. No respiratory distress.     Breath sounds: Normal breath sounds.  Abdominal:     General: There is no distension.     Palpations: Abdomen is soft.     Tenderness: There is no abdominal tenderness.  Musculoskeletal:        General: No deformity. Normal range of motion.     Cervical back: Normal range of motion and neck supple.  Skin:    General: Skin is warm and dry.  Neurological:      General: No focal deficit present.     Mental Status: He is alert and oriented to person, place, and time. Mental status is at baseline.     GCS: GCS eye subscore is 4. GCS verbal subscore is 5. GCS motor subscore is 6.     Cranial Nerves: No cranial nerve deficit, dysarthria or facial asymmetry.     Sensory: No sensory deficit.     Motor: No weakness.     Gait: Gait normal.     (all labs ordered are listed, but only abnormal results are displayed) Labs Reviewed  CBC WITH DIFFERENTIAL/PLATELET  COMPREHENSIVE METABOLIC PANEL WITH GFR  I-STAT CHEM 8, ED    EKG: None  Radiology: No results found.   Procedures   Medications Ordered in the ED  oxyCODONE -acetaminophen  (PERCOCET/ROXICET) 5-325 MG per tablet 1 tablet (1 tablet Oral Given 09/09/23 2349)                                    Medical Decision Making Patient is with left sided neck pain.  Describes symptoms are most consistent with muscular torticollis.  However, the patient did report transient visual disturbance associated with his pain.  Described symptoms are not consistent with likely TIA/CVA.  Obtained workup including imaging is without acute abnormality.  Specifically no sign of CVA, dissection, etc. seen.  His symptoms are much improved after administration of Toradol .  He is appropriate for discharge home.  Importance of close follow-up is stressed.  Strict return precautions given and understood.  Amount and/or Complexity of Data Reviewed Labs: ordered. Radiology: ordered.  Risk Prescription drug management.        Final diagnoses:  Neck pain    ED Discharge Orders     None          Laurice Maude BROCKS, MD 09/10/23 1521

## 2023-09-10 NOTE — Discharge Instructions (Signed)
 Return for any problem.   Use Tylenol  and Motrin  for pain.

## 2023-10-24 ENCOUNTER — Ambulatory Visit (HOSPITAL_COMMUNITY)
Admission: EM | Admit: 2023-10-24 | Discharge: 2023-10-24 | Disposition: A | Discharge status: Home / Self Care | Payer: Self-pay | Attending: Family Medicine | Admitting: Family Medicine

## 2023-10-24 ENCOUNTER — Encounter (HOSPITAL_COMMUNITY): Payer: Self-pay

## 2023-10-24 DIAGNOSIS — M109 Gout, unspecified: Secondary | ICD-10-CM

## 2023-10-24 MED ORDER — DEXAMETHASONE SODIUM PHOSPHATE 10 MG/ML IJ SOLN
INTRAMUSCULAR | Status: AC
  Filled 2023-10-24: qty 1

## 2023-10-24 MED ORDER — PREDNISONE 20 MG PO TABS: 40.0000 mg | ORAL_TABLET | Freq: Every day | ORAL | 0 refills | Status: AC

## 2023-10-24 MED ORDER — COLCHICINE 0.6 MG PO TABS: 0.6000 mg | ORAL_TABLET | Freq: Every day | ORAL | 0 refills | Status: DC | PRN

## 2023-10-24 MED ORDER — DEXAMETHASONE SODIUM PHOSPHATE 10 MG/ML IJ SOLN
10.0000 mg | Freq: Once | INTRAMUSCULAR | Status: AC
Start: 2023-10-24 — End: 2023-10-24
  Administered 2023-10-24: 10 mg via INTRAMUSCULAR

## 2023-10-24 NOTE — ED Provider Notes (Signed)
 [facility]    CSN: [id number] Arrival date & time: 10/24/23  1049      History   Chief Complaint Chief Complaint  Patient presents with   Foot Pain    HPI Danny Kim is a 53 y.o. male.    Foot Pain  Here for pain in his right distal foot around the first MTP. Began bothering him last night.  No fall or trauma  He does have a history of gout and this is usually where it bothers him.  NKDA He is not taking any daily medications.  Apparently he was prescribed colchicine  and he had understood he was supposed to take that daily to prevent gout.  He is not taking allopurinol  Last eGFR was greater than 60 He does not have diabetes   Past Medical History:  Diagnosis Date   DVT (deep venous thrombosis) (HCC)    Generalized headaches    Gout    Hypertension     Patient Active Problem List   Diagnosis Date Noted   Dizziness 09/20/2022   Primary hypertension 04/29/2021   Mixed hyperlipidemia 04/29/2021   Mass of head 01/21/2020   Gout 01/21/2020    History reviewed. No pertinent surgical history.     Home Medications    Prior to Admission medications   Medication Sig Start Date End Date Taking? Authorizing Provider  colchicine  0.6 MG tablet Take 1 tablet (0.6 mg total) by mouth daily as needed (gout pain). 10/24/23  Yes Vonna Sharlet POUR, MD  predniSONE  (DELTASONE ) 20 MG tablet Take 2 tablets (40 mg total) by mouth daily with breakfast for 5 days. 10/24/23 10/29/23 Yes Denzel Etienne, Sharlet POUR, MD  amLODipine  (NORVASC ) 5 MG tablet TAKE 1 TABLET (5 MG TOTAL) BY MOUTH DAILY. 09/09/23   Adele Song, MD  cyclobenzaprine  (FLEXERIL ) 10 MG tablet Take 1 tablet (10 mg total) by mouth 2 (two) times daily as needed for muscle spasms. 11/28/22   Tobi Fellows, MD  diphenhydrAMINE  (BENADRYL ) 25 MG tablet Take 1 tablet (25 mg total) by mouth every 6 (six) hours as needed. 12/22/22   Dreama Cathey SAILOR, FNP  multivitamin-iron-minerals-folic acid (CENTRUM) chewable tablet  Chew 1 tablet by mouth daily.    [provider]    Family History History reviewed. No pertinent family history.  Social History Social History   Tobacco Use   Smoking status: Never    Passive exposure: Never   Smokeless tobacco: Never  Vaping Use   Vaping status: Never Used  Substance Use Topics   Alcohol use: No   Drug use: No     Allergies   Bee venom   Review of Systems Review of Systems   Physical Exam Triage Vital Signs ED Triage Vitals  Encounter Vitals Group     BP 10/24/23 1130 (!) 140/103     Girls Systolic BP Percentile --      Girls Diastolic BP Percentile --      Boys Systolic BP Percentile --      Boys Diastolic BP Percentile --      Pulse Rate 10/24/23 1130 73     Resp 10/24/23 1130 18     Temp 10/24/23 1130 97.8 F (36.6 C)     Temp src --      SpO2 10/24/23 1130 98 %     Weight --      Height --      Head Circumference --      Peak Flow --  Pain Score 10/24/23 1129 9     Pain Loc --      Pain Education --      Exclude from Growth Chart --    No data found.  Updated Vital Signs BP (!) 140/103   Pulse 73   Temp 97.8 F (36.6 C)   Resp 18   SpO2 98%   Visual Acuity Right Eye Distance:   Left Eye Distance:   Bilateral Distance:    Right Eye Near:   Left Eye Near:    Bilateral Near:     Physical Exam Vitals reviewed.  Constitutional:      General: He is not in acute distress.    Appearance: He is not ill-appearing, toxic-appearing or diaphoretic.  Musculoskeletal:     Comments: There is some swelling and tenderness around the first MTP of the right foot.  No bruising and no deformity  Skin:    Coloration: Skin is not jaundiced or pale.  Neurological:     General: No focal deficit present.     Mental Status: He is alert and oriented to person, place, and time.  Psychiatric:        Behavior: Behavior normal.      UC Treatments / Results  Labs (all labs ordered are listed, but only abnormal results are  displayed) Labs Reviewed - No data to display  EKG   Radiology No results found.  Procedures Procedures (including critical care time)  Medications Ordered in UC Medications  dexamethasone  (DECADRON ) injection 10 mg (has no administration in time range)    Initial Impression / Assessment and Plan / UC Course  I have reviewed the triage vital signs and the nursing notes.  Pertinent labs & imaging results that were available during my care of the patient were reviewed by me and considered in my medical decision making (see chart for details).     Decadron  is given here and prednisone  and a 5-day burst is sent to the pharmacy, along with colchicine  to take daily as needed  I have asked him to follow-up with primary care Final Clinical Impressions(s) / UC Diagnoses   Final diagnoses:  Exacerbation of gout     Discharge Instructions      You have been given a shot of dexamethasone  10 mg, steroid.  Take prednisone  20 mg--2 daily for 5 days  Colchicine  0.6 mg--1 tablet daily as needed for gout pain  Please follow-up with your primary care about this issue, as there are other medications that can lower your uric acid levels and make gout flares less frequent.     ED Prescriptions     Medication Sig Dispense Auth. Provider   colchicine  0.6 MG tablet Take 1 tablet (0.6 mg total) by mouth daily as needed (gout pain). 15 tablet Iveliz Garay K, MD   predniSONE  (DELTASONE ) 20 MG tablet Take 2 tablets (40 mg total) by mouth daily with breakfast for 5 days. 10 tablet Vonna Arlissa Monteverde K, MD      PDMP not reviewed this encounter.   Vonna Sharlet POUR, MD 10/24/23 [phone]

## 2023-10-24 NOTE — Discharge Instructions (Addendum)
 You have been given a shot of dexamethasone  10 mg, steroid.  Take prednisone  20 mg--2 daily for 5 days  Colchicine  0.6 mg--1 tablet daily as needed for gout pain  Please follow-up with your primary care about this issue, as there are other medications that can lower your uric acid levels and make gout flares less frequent.

## 2023-10-24 NOTE — ED Triage Notes (Signed)
 Pt presents with concern for gout flare up in right foot. History of gout and this feels the same. Pain started last night. Denies any other symptoms.

## 2023-11-29 ENCOUNTER — Other Ambulatory Visit: Payer: Self-pay

## 2023-11-29 ENCOUNTER — Ambulatory Visit (HOSPITAL_COMMUNITY)
Admission: EM | Admit: 2023-11-29 | Discharge: 2023-11-29 | Disposition: A | Discharge status: Home / Self Care | Payer: Self-pay | Attending: Family Medicine | Admitting: Family Medicine

## 2023-11-29 ENCOUNTER — Encounter (HOSPITAL_COMMUNITY): Payer: Self-pay | Admitting: *Deleted

## 2023-11-29 DIAGNOSIS — M109 Gout, unspecified: Secondary | ICD-10-CM

## 2023-11-29 MED ORDER — PREDNISONE 20 MG PO TABS: 40.0000 mg | ORAL_TABLET | Freq: Every day | ORAL | 0 refills | Status: AC

## 2023-11-29 MED ORDER — COLCHICINE 0.6 MG PO TABS: 0.6000 mg | ORAL_TABLET | Freq: Every day | ORAL | 0 refills | Status: DC | PRN

## 2023-11-29 NOTE — ED Provider Notes (Signed)
 [facility]    CSN: [id number] Arrival date & time: 11/29/23  0913      History   Chief Complaint Chief Complaint  Patient presents with   Foot Pain    HPI Danny Kim is a 53 y.o. male.    Foot Pain  Here for pain in his left heel.  It began bothering him about 2 days ago.  Pain is rated as 8 out of 10.  He had similar trouble in late September when I saw him.  The treatment for the gout flare helped a good bit at that time and it resolved.  He has not followed up with his primary care as I requested at that visit.  No fever or trauma.  NKDA    Past Medical History:  Diagnosis Date   DVT (deep venous thrombosis) (HCC)    Generalized headaches    Gout    Hypertension     Patient Active Problem List   Diagnosis Date Noted   Dizziness 09/20/2022   Primary hypertension 04/29/2021   Mixed hyperlipidemia 04/29/2021   Mass of head 01/21/2020   Gout 01/21/2020    History reviewed. No pertinent surgical history.     Home Medications    Prior to Admission medications   Medication Sig Start Date End Date Taking? Authorizing Provider  amLODipine  (NORVASC ) 5 MG tablet TAKE 1 TABLET (5 MG TOTAL) BY MOUTH DAILY. 09/09/23  Yes Hindel, Leah, MD  colchicine  0.6 MG tablet Take 1 tablet (0.6 mg total) by mouth daily as needed (gout pain). 11/29/23  Yes Lielle Vandervort K, MD  multivitamin-iron-minerals-folic acid (CENTRUM) chewable tablet Chew 1 tablet by mouth daily.   Yes [provider]  predniSONE  (DELTASONE ) 20 MG tablet Take 2 tablets (40 mg total) by mouth daily with breakfast for 5 days. 11/29/23 12/04/23 Yes Vonna Sharlet POUR, MD    Family History History reviewed. No pertinent family history.  Social History Social History   Tobacco Use   Smoking status: Never    Passive exposure: Never   Smokeless tobacco: Never  Vaping Use   Vaping status: Never Used  Substance Use Topics   Alcohol use: No   Drug use: No     Allergies   Bee  venom   Review of Systems Review of Systems   Physical Exam Triage Vital Signs ED Triage Vitals  Encounter Vitals Group     BP 11/29/23 1118 132/86     Girls Systolic BP Percentile --      Girls Diastolic BP Percentile --      Boys Systolic BP Percentile --      Boys Diastolic BP Percentile --      Pulse Rate 11/29/23 1118 68     Resp 11/29/23 1118 18     Temp 11/29/23 1118 97.8 F (36.6 C)     Temp src --      SpO2 11/29/23 1118 95 %     Weight --      Height --      Head Circumference --      Peak Flow --      Pain Score 11/29/23 1116 8     Pain Loc --      Pain Education --      Exclude from Growth Chart --    No data found.  Updated Vital Signs BP 132/86   Pulse 68   Temp 97.8 F (36.6 C)   Resp 18   SpO2 95%  Visual Acuity Right Eye Distance:   Left Eye Distance:   Bilateral Distance:    Right Eye Near:   Left Eye Near:    Bilateral Near:     Physical Exam Vitals reviewed.  Constitutional:      General: He is not in acute distress.    Appearance: He is not ill-appearing, toxic-appearing or diaphoretic.  Cardiovascular:     Rate and Rhythm: Normal rate and regular rhythm.     Heart sounds: No murmur heard. Pulmonary:     Effort: Pulmonary effort is normal.     Breath sounds: Normal breath sounds.  Musculoskeletal:     Cervical back: Neck supple.     Comments: There is tenderness and mild erythema of the left heel.  Pulses are normal in that foot.  Lymphadenopathy:     Cervical: No cervical adenopathy.  Skin:    Coloration: Skin is not jaundiced or pale.  Neurological:     General: No focal deficit present.     Mental Status: He is alert and oriented to person, place, and time.  Psychiatric:        Behavior: Behavior normal.      UC Treatments / Results  Labs (all labs ordered are listed, but only abnormal results are displayed) Labs Reviewed - No data to display  EKG   Radiology No results found.  Procedures Procedures  (including critical care time)  Medications Ordered in UC Medications - No data to display  Initial Impression / Assessment and Plan / UC Course  I have reviewed the triage vital signs and the nursing notes.  Pertinent labs & imaging results that were available during my care of the patient were reviewed by me and considered in my medical decision making (see chart for details).     5-day burst of prednisone  and colchicine  are sent in to treat his gout flare.  I have asked him again to follow-up with his primary care so that he can possibly take some daily medications to reduce his frequency of gout exacerbations. Final Clinical Impressions(s) / UC Diagnoses   Final diagnoses:  Exacerbation of gout     Discharge Instructions      Take prednisone  20 mg--2 daily for 5 days  Colchicine  0.6 mg--1 tablet daily as needed for gout pain  Please call your primary care provider, and she is listed in this discharge summary.  They can work with you to help control your gout     ED Prescriptions     Medication Sig Dispense Auth. Provider   colchicine  0.6 MG tablet Take 1 tablet (0.6 mg total) by mouth daily as needed (gout pain). 15 tablet Blase Beckner K, MD   predniSONE  (DELTASONE ) 20 MG tablet Take 2 tablets (40 mg total) by mouth daily with breakfast for 5 days. 10 tablet Vonna Markell Sciascia K, MD      PDMP not reviewed this encounter.   Vonna Sharlet POUR, MD 11/29/23 [phone]

## 2023-11-29 NOTE — ED Triage Notes (Signed)
 PT seen 10/24/23 for pain located on LT heel. Pt reports pain did improve but now has pain in LT heel again. Pain 8/10.

## 2023-11-29 NOTE — Discharge Instructions (Signed)
 Take prednisone  20 mg--2 daily for 5 days  Colchicine  0.6 mg--1 tablet daily as needed for gout pain  Please call your primary care provider, and she is listed in this discharge summary.  They can work with you to help control your gout

## 2023-12-15 ENCOUNTER — Other Ambulatory Visit: Payer: Self-pay | Admitting: Family Medicine

## 2023-12-15 DIAGNOSIS — I10 Essential (primary) hypertension: Secondary | ICD-10-CM

## 2024-01-30 ENCOUNTER — Encounter: Payer: Self-pay | Admitting: Family Medicine

## 2024-01-30 ENCOUNTER — Ambulatory Visit: Payer: Self-pay | Admitting: Family Medicine

## 2024-01-30 VITALS — BP 118/82 | HR 74 | Wt 183.0 lb

## 2024-01-30 DIAGNOSIS — Z1211 Encounter for screening for malignant neoplasm of colon: Secondary | ICD-10-CM

## 2024-01-30 DIAGNOSIS — K219 Gastro-esophageal reflux disease without esophagitis: Secondary | ICD-10-CM

## 2024-01-30 DIAGNOSIS — Z0000 Encounter for general adult medical examination without abnormal findings: Secondary | ICD-10-CM

## 2024-01-30 DIAGNOSIS — E782 Mixed hyperlipidemia: Secondary | ICD-10-CM

## 2024-01-30 DIAGNOSIS — I10 Essential (primary) hypertension: Secondary | ICD-10-CM

## 2024-01-30 DIAGNOSIS — M1A9XX0 Chronic gout, unspecified, without tophus (tophi): Secondary | ICD-10-CM

## 2024-01-30 DIAGNOSIS — Z1159 Encounter for screening for other viral diseases: Secondary | ICD-10-CM

## 2024-01-30 MED ORDER — ALLOPURINOL 100 MG PO TABS: 100.0000 mg | ORAL_TABLET | Freq: Every day | ORAL | 6 refills | Status: AC

## 2024-01-30 MED ORDER — COLCHICINE 0.6 MG PO TABS: 0.6000 mg | ORAL_TABLET | Freq: Every day | ORAL | 0 refills | Status: AC | PRN

## 2024-01-30 NOTE — Assessment & Plan Note (Signed)
 Blood pressure well-controlled in office today, however patient reports elevated systolic readings at home.  Advised checking blood pressure first thing in the morning daily and bringing readings to next visit in 1 to 2 weeks.  Can consider changing medication at that time if needed.

## 2024-01-30 NOTE — Progress Notes (Signed)
" ° ° °  SUBJECTIVE:   CHIEF COMPLAINT / HPI:   F/u HTN --regimen: amlodipine  5mg  --checks at home, systolic ranges from 135-150s  HLD --last lipid panel 2023- TC 244, LDL 142 --treated with lifestyle change since then  GERD --has bothered him for a few years --has been taking a medicine from [place] that helps, has not taken for about 1 month. Not sure what it is. Possibly an herbal supplement?  Gout --flares in L midfoot every 2-3 weeks --has never taken a preventative medication -- Multiple urgent care visits this year for gout flares including 11/4, 9/29, 6/18 --not having a flare currently  Healthcare maintenance --due for colonoscopy --needs pneumococcal, VZV vaccines  PERTINENT  PMH / PSH: HTN, gout, HLD  OBJECTIVE:   BP 118/82   Pulse 74   Wt 183 lb (83 kg)   SpO2 96%   BMI 30.45 kg/m   - General: No acute distress. Awake and conversant. - Eyes: Normal conjunctiva, anicteric. Round symmetric pupils. - ENT: Hearing grossly intact. No nasal discharge. - Neck: Neck is supple. No masses or thyromegaly. - Respiratory: Respirations are non-labored. - Skin: No visible rashes or ulcers. - Psych: Alert and oriented. Cooperative, Appropriate mood and affect, Normal judgment. - MSK: Normal ambulation.  Dorsal left foot with normal appearance, no swelling, warmth, tenderness, crepitus - Neuro: Sensation and CN II-XII grossly normal.  ASSESSMENT/PLAN:   Assessment & Plan Primary hypertension Blood pressure well-controlled in office today, however patient reports elevated systolic readings at home.  Advised checking blood pressure first thing in the morning daily and bringing readings to next visit in 1 to 2 weeks.  Can consider changing medication at that time if needed. Mixed hyperlipidemia Elevated cholesterol and LDL at last screening in 2023, will repeat lipid panel today Chronic gout involving toe of left foot without tophus, unspecified cause Frequent gout flares  that have responded well to colchicine .  Chart review with note of uric acid above 6 in the past.  Will initiate treatment with allopurinol , providing colchicine  prescription in case this triggers a flare. colchicine  0.6 MG tablet; Take 1 tablet (0.6 mg total) by mouth daily as needed (gout pain).  Dispense: 15 tablet; Refill: 0 - allopurinol  (ZYLOPRIM ) 100 MG tablet; Take 1 tablet (100 mg total) by mouth daily.  Dispense: 30 tablet; Refill: 6 Gastroesophageal reflux disease, unspecified whether esophagitis present Does not sound like he has been taking a PPI or famotidine recently so will test for H pylori today. - H. pylori breath test Colon cancer screening Has not had colonoscopy before - Ambulatory referral to Gastroenterology Need for hepatitis B screening test States he received hep B vaccines as an adult.  Will collect HBV panel to ensure immunity. - Hepatitis B surface antigen - Hepatitis B core antibody, total - Hepatitis B surface antibody,quantitative Healthcare maintenance Due for COVID-19, shingles and pneumococcal vaccines. Patient is self-pay and prefers to have vaccines administered at pharmacy    Rea Raring, MD [facility] "

## 2024-01-30 NOTE — Patient Instructions (Addendum)
 Thank you for coming in today! Here is a summary of what we discussed:  -Please check your blood pressure every morning and send me the readings via MyChart (or bring them to your next appointment) so we can adjust your medication if needed  -I am starting you on a daily medication to help prevent gout flares. Starting this medication may trigger a gout flare- I also send in Colchicine  for you to take if this happens.  -You can get the Shingrix vaccine at the pharmacy. You will need a 2nd shot 2-6 months after the first. This vaccine is important to help prevent shingles.   -You can also ask for the pneumococcal vaccine at your pharmacy  --Schedule your colonoscopy to help detect colon cancer. The stomach doctors will call to schedule an appt with you. If you decide against this, please ask about the cologuard as an option.     We are checking some labs today. If they are abnormal, I will call you. If they are normal, I will send you a MyChart message (if it is active) or a letter in the mail. If you do not hear about your labs in the next 2 weeks, please call the office.   Please schedule a follow up with me in 1-2 weeks.  Please call the clinic at [phone] if your symptoms worsen or you have any concerns.  Best, Dr Adele

## 2024-01-30 NOTE — Assessment & Plan Note (Signed)
 Elevated cholesterol and LDL at last screening in 2023, will repeat lipid panel today

## 2024-01-30 NOTE — Assessment & Plan Note (Signed)
 Frequent gout flares that have responded well to colchicine .  Chart review with note of uric acid above 6 in the past.  Will initiate treatment with allopurinol , providing colchicine  prescription in case this triggers a flare. colchicine  0.6 MG tablet; Take 1 tablet (0.6 mg total) by mouth daily as needed (gout pain).  Dispense: 15 tablet; Refill: 0 - allopurinol  (ZYLOPRIM ) 100 MG tablet; Take 1 tablet (100 mg total) by mouth daily.  Dispense: 30 tablet; Refill: 6

## 2024-02-01 ENCOUNTER — Encounter: Payer: Self-pay | Admitting: Family Medicine

## 2024-02-01 ENCOUNTER — Ambulatory Visit: Payer: Self-pay | Admitting: Family Medicine

## 2024-02-01 ENCOUNTER — Other Ambulatory Visit (HOSPITAL_COMMUNITY): Payer: Self-pay

## 2024-02-01 DIAGNOSIS — B9681 Helicobacter pylori [H. pylori] as the cause of diseases classified elsewhere: Secondary | ICD-10-CM

## 2024-02-01 LAB — LIPID PANEL
Chol/HDL Ratio: 5.8 ratio — ABNORMAL HIGH (ref 0.0–5.0)
Cholesterol, Total: 225 mg/dL — ABNORMAL HIGH (ref 100–199)
HDL: 39 mg/dL — ABNORMAL LOW
LDL Chol Calc (NIH): 139 mg/dL — ABNORMAL HIGH (ref 0–99)
Triglycerides: 262 mg/dL — ABNORMAL HIGH (ref 0–149)
VLDL Cholesterol Cal: 47 mg/dL — ABNORMAL HIGH (ref 5–40)

## 2024-02-01 LAB — HEPATITIS B CORE ANTIBODY, TOTAL: Hep B Core Total Ab: POSITIVE — AB

## 2024-02-01 LAB — HEPATITIS B SURFACE ANTIGEN: Hepatitis B Surface Ag: NEGATIVE

## 2024-02-01 LAB — H. PYLORI BREATH TEST: H pylori Breath Test: POSITIVE — AB

## 2024-02-01 LAB — HEPATITIS B SURFACE ANTIBODY, QUANTITATIVE: Hepatitis B Surf Ab Quant: 89.6 m[IU]/mL

## 2024-02-01 MED ORDER — BISMUTH/METRONIDAZ/TETRACYCLIN 140-125-125 MG PO CAPS
3.0000 | ORAL_CAPSULE | Freq: Four times a day (QID) | ORAL | 0 refills | Status: DC
  Filled 2024-02-01: qty 120, 10d supply, fill #0

## 2024-02-01 MED ORDER — OMEPRAZOLE 40 MG PO CPDR
40.0000 mg | DELAYED_RELEASE_CAPSULE | Freq: Two times a day (BID) | ORAL | 0 refills | Status: DC
  Filled 2024-02-01: qty 28, 14d supply, fill #0

## 2024-02-02 ENCOUNTER — Other Ambulatory Visit (HOSPITAL_COMMUNITY): Payer: Self-pay

## 2024-02-02 ENCOUNTER — Encounter: Payer: Self-pay | Admitting: Family Medicine

## 2024-02-02 ENCOUNTER — Other Ambulatory Visit: Payer: Self-pay | Admitting: Family Medicine

## 2024-02-02 DIAGNOSIS — A048 Other specified bacterial intestinal infections: Secondary | ICD-10-CM

## 2024-02-02 DIAGNOSIS — B9681 Helicobacter pylori [H. pylori] as the cause of diseases classified elsewhere: Secondary | ICD-10-CM

## 2024-02-02 MED ORDER — METRONIDAZOLE 250 MG PO TABS: 250.0000 mg | ORAL_TABLET | Freq: Four times a day (QID) | ORAL | 0 refills | Status: AC

## 2024-02-02 MED ORDER — BISMUTH SUBSALICYLATE 525 MG PO TABS: 1.0000 | ORAL_TABLET | Freq: Four times a day (QID) | ORAL | 0 refills | Status: AC

## 2024-02-02 MED ORDER — OMEPRAZOLE 40 MG PO CPDR: 40.0000 mg | DELAYED_RELEASE_CAPSULE | Freq: Two times a day (BID) | ORAL | 0 refills | Status: AC

## 2024-02-02 MED ORDER — TETRACYCLINE HCL 500 MG PO CAPS: 500.0000 mg | ORAL_CAPSULE | Freq: Four times a day (QID) | ORAL | 0 refills | Status: AC

## 2024-02-07 ENCOUNTER — Encounter: Payer: Self-pay | Admitting: Family Medicine

## 2024-02-07 ENCOUNTER — Ambulatory Visit (INDEPENDENT_AMBULATORY_CARE_PROVIDER_SITE_OTHER): Payer: Self-pay | Admitting: Family Medicine

## 2024-02-07 VITALS — BP 129/83 | HR 86 | Ht 65.0 in | Wt 181.0 lb

## 2024-02-07 DIAGNOSIS — M1A9XX0 Chronic gout, unspecified, without tophus (tophi): Secondary | ICD-10-CM

## 2024-02-07 DIAGNOSIS — A048 Other specified bacterial intestinal infections: Secondary | ICD-10-CM

## 2024-02-07 DIAGNOSIS — I10 Essential (primary) hypertension: Secondary | ICD-10-CM

## 2024-02-07 DIAGNOSIS — E782 Mixed hyperlipidemia: Secondary | ICD-10-CM

## 2024-02-07 DIAGNOSIS — K219 Gastro-esophageal reflux disease without esophagitis: Secondary | ICD-10-CM

## 2024-02-07 NOTE — Patient Instructions (Addendum)
 Thank you for coming in today! Here is a summary of what we discussed:  Please take the for medications as we discussed.  You will take 3 of them (tetracycline , bismuth  subsalicylate, metronidazole ) 4 times a day and one of them (omeprazole ) twice a day for 14 days total.  We can test to see if the infection has resolved 2 weeks after you are done with the medicines.  So make a follow-up appointment in about 1 month.  Please hold off on starting the allopurinol  for now.  Your colonoscopy referral has been authorized.  You can call the number below to discuss scheduling this. [facility] [address] [place] [address], [place], [place] [address] Phone: [phone]   Please call the clinic at [phone] if your symptoms worsen or you have any concerns.  Best, Dr Adele

## 2024-02-07 NOTE — Progress Notes (Unsigned)
" ° ° °  SUBJECTIVE:   CHIEF COMPLAINT / HPI:   H pylori - quad therapy  Lifestyle change for HLD  Gout - has not started allopurinol  yet - no gout flares since last visit  HTN - amlodipine  5mg , reports higher home readings  PERTINENT  PMH / PSH: ***  OBJECTIVE:   BP 129/83   Pulse 86   Ht 5' 5 (1.651 m)   Wt 181 lb (82.1 kg)   SpO2 96%   BMI 30.12 kg/m   ***  ASSESSMENT/PLAN:   Assessment & Plan      Rea Raring, MD [facility] "

## 2024-02-08 ENCOUNTER — Other Ambulatory Visit: Payer: Self-pay | Admitting: Family Medicine

## 2024-02-08 DIAGNOSIS — I10 Essential (primary) hypertension: Secondary | ICD-10-CM

## 2024-02-08 MED ORDER — AMLODIPINE BESYLATE 5 MG PO TABS: 5.0000 mg | ORAL_TABLET | Freq: Every day | ORAL | 3 refills | Status: AC

## 2024-02-08 NOTE — Assessment & Plan Note (Signed)
-   Well-controlled, continue current regimen

## 2024-02-08 NOTE — Assessment & Plan Note (Signed)
 Reviewed lifestyle changes such as increasing physical activity and healthy diet.

## 2024-02-08 NOTE — Assessment & Plan Note (Signed)
 Stable, no concerns.  Advised patient to wait to start allopurinol  until after completing H. pylori treatment.

## 2024-03-19 ENCOUNTER — Ambulatory Visit: Payer: Self-pay | Admitting: Family Medicine
